# Patient Record
Sex: Female | Born: 1950 | Race: White | Hispanic: No | Marital: Married | Smoking: Never smoker
Health system: Southern US, Community
[De-identification: ages and names within clinical notes are randomized; demographics above are authoritative.]

## PROBLEM LIST (undated history)

## (undated) DIAGNOSIS — E785 Hyperlipidemia, unspecified: Secondary | ICD-10-CM

## (undated) DIAGNOSIS — I10 Essential (primary) hypertension: Secondary | ICD-10-CM

## (undated) DIAGNOSIS — K5792 Diverticulitis of intestine, part unspecified, without perforation or abscess without bleeding: Secondary | ICD-10-CM

## (undated) DIAGNOSIS — I8390 Asymptomatic varicose veins of unspecified lower extremity: Secondary | ICD-10-CM

## (undated) DIAGNOSIS — E119 Type 2 diabetes mellitus without complications: Secondary | ICD-10-CM

## (undated) HISTORY — DX: Asymptomatic varicose veins of unspecified lower extremity: I83.90

## (undated) HISTORY — DX: Type 2 diabetes mellitus without complications: E11.9

## (undated) HISTORY — DX: Hyperlipidemia, unspecified: E78.5

## (undated) HISTORY — PX: ABDOMINAL HYSTERECTOMY: SHX81

---

## 2002-09-01 ENCOUNTER — Encounter: Admission: RE | Admit: 2002-09-01 | Discharge: 2002-09-01 | Payer: Self-pay | Admitting: Urology

## 2002-09-01 ENCOUNTER — Encounter: Payer: Self-pay | Admitting: Urology

## 2003-06-14 ENCOUNTER — Other Ambulatory Visit: Admission: RE | Admit: 2003-06-14 | Discharge: 2003-06-14 | Payer: Self-pay | Admitting: Gynecology

## 2004-09-08 ENCOUNTER — Other Ambulatory Visit: Admission: RE | Admit: 2004-09-08 | Discharge: 2004-09-08 | Payer: Self-pay | Admitting: Gynecology

## 2005-12-28 ENCOUNTER — Other Ambulatory Visit: Admission: RE | Admit: 2005-12-28 | Discharge: 2005-12-28 | Payer: Self-pay | Admitting: Gynecology

## 2006-07-19 ENCOUNTER — Ambulatory Visit: Payer: Self-pay | Admitting: Pulmonary Disease

## 2006-08-17 ENCOUNTER — Ambulatory Visit: Payer: Self-pay | Admitting: Pulmonary Disease

## 2006-08-17 LAB — CONVERTED CEMR LAB
ALT: 18 units/L (ref 0–40)
AST: 26 units/L (ref 0–37)
Albumin: 4.1 g/dL (ref 3.5–5.2)
Alkaline Phosphatase: 73 units/L (ref 39–117)
BUN: 20 mg/dL (ref 6–23)
Basophils Absolute: 0 10*3/uL (ref 0.0–0.1)
Basophils Relative: 0.3 % (ref 0.0–1.0)
Bilirubin Urine: NEGATIVE
CO2: 25 meq/L (ref 19–32)
Calcium: 9.5 mg/dL (ref 8.4–10.5)
Chloride: 100 meq/L (ref 96–112)
Chol/HDL Ratio, serum: 5.9
Cholesterol: 226 mg/dL (ref 0–200)
Creatinine, Ser: 0.7 mg/dL (ref 0.4–1.2)
Eosinophil percent: 2 % (ref 0.0–5.0)
GFR calc non Af Amer: 92 mL/min
Glomerular Filtration Rate, Af Am: 111 mL/min/{1.73_m2}
Glucose, Bld: 112 mg/dL — ABNORMAL HIGH (ref 70–99)
HCT: 39.3 % (ref 36.0–46.0)
HDL: 38.3 mg/dL — ABNORMAL LOW (ref >39.0)
Hemoglobin, Urine: NEGATIVE
Hemoglobin: 13.4 g/dL (ref 12.0–15.0)
Ketones, ur: NEGATIVE mg/dL
LDL DIRECT: 165.5 mg/dL
Lymphocytes Relative: 40.7 % (ref 12.0–46.0)
MCHC: 34.1 g/dL (ref 30.0–36.0)
MCV: 90.7 fL (ref 78.0–100.0)
Monocytes Absolute: 0.4 10*3/uL (ref 0.2–0.7)
Monocytes Relative: 7.4 % (ref 3.0–11.0)
Mucus, UA: NEGATIVE
Neutro Abs: 2.5 10*3/uL (ref 1.4–7.7)
Neutrophils Relative %: 49.6 % (ref 43.0–77.0)
Nitrite: POSITIVE — AB
Platelets: 296 10*3/uL (ref 150–400)
Potassium: 4 meq/L (ref 3.5–5.1)
RBC / HPF: NONE SEEN
RBC: 4.33 M/uL (ref 3.87–5.11)
RDW: 13.2 % (ref 11.5–14.6)
Sodium: 134 meq/L — ABNORMAL LOW (ref 135–145)
Specific Gravity, Urine: 1.025 (ref 1.000–1.03)
TSH: 1.16 microintl units/mL (ref 0.35–5.50)
Total Bilirubin: 0.7 mg/dL (ref 0.3–1.2)
Total Protein, Urine: NEGATIVE mg/dL
Total Protein: 7.8 g/dL (ref 6.0–8.3)
Triglyceride fasting, serum: 167 mg/dL — ABNORMAL HIGH (ref 0–149)
Urine Glucose: NEGATIVE mg/dL
Urobilinogen, UA: 0.2 (ref 0.0–1.0)
VLDL: 33 mg/dL (ref 0–40)
WBC: 5 10*3/uL (ref 4.5–10.5)
pH: 5.5 (ref 5.0–8.0)

## 2006-12-06 ENCOUNTER — Other Ambulatory Visit: Admission: RE | Admit: 2006-12-06 | Discharge: 2006-12-06 | Payer: Self-pay | Admitting: Gynecology

## 2008-03-20 ENCOUNTER — Ambulatory Visit: Payer: Self-pay | Admitting: Internal Medicine

## 2008-03-20 DIAGNOSIS — J309 Allergic rhinitis, unspecified: Secondary | ICD-10-CM | POA: Insufficient documentation

## 2008-03-20 DIAGNOSIS — D126 Benign neoplasm of colon, unspecified: Secondary | ICD-10-CM

## 2008-03-20 DIAGNOSIS — M1990 Unspecified osteoarthritis, unspecified site: Secondary | ICD-10-CM | POA: Insufficient documentation

## 2008-03-20 DIAGNOSIS — J209 Acute bronchitis, unspecified: Secondary | ICD-10-CM

## 2008-03-20 DIAGNOSIS — I10 Essential (primary) hypertension: Secondary | ICD-10-CM | POA: Insufficient documentation

## 2008-03-20 DIAGNOSIS — K279 Peptic ulcer, site unspecified, unspecified as acute or chronic, without hemorrhage or perforation: Secondary | ICD-10-CM | POA: Insufficient documentation

## 2008-03-20 DIAGNOSIS — E7800 Pure hypercholesterolemia, unspecified: Secondary | ICD-10-CM | POA: Insufficient documentation

## 2008-03-20 DIAGNOSIS — F411 Generalized anxiety disorder: Secondary | ICD-10-CM | POA: Insufficient documentation

## 2008-03-30 ENCOUNTER — Encounter
Admission: RE | Admit: 2008-03-30 | Discharge: 2008-03-30 | Payer: Self-pay | Admitting: Physical Medicine and Rehabilitation

## 2008-05-16 ENCOUNTER — Encounter: Payer: Self-pay | Admitting: Endocrinology

## 2008-06-06 ENCOUNTER — Encounter: Payer: Self-pay | Admitting: Pulmonary Disease

## 2008-06-08 ENCOUNTER — Encounter: Payer: Self-pay | Admitting: Pulmonary Disease

## 2008-06-25 ENCOUNTER — Telehealth (INDEPENDENT_AMBULATORY_CARE_PROVIDER_SITE_OTHER): Payer: Self-pay | Admitting: *Deleted

## 2008-06-28 ENCOUNTER — Ambulatory Visit: Payer: Self-pay | Admitting: Endocrinology

## 2008-06-28 DIAGNOSIS — E119 Type 2 diabetes mellitus without complications: Secondary | ICD-10-CM | POA: Insufficient documentation

## 2008-08-13 ENCOUNTER — Encounter: Payer: Self-pay | Admitting: Pulmonary Disease

## 2010-05-29 ENCOUNTER — Encounter: Payer: Self-pay | Admitting: Pulmonary Disease

## 2010-09-20 ENCOUNTER — Encounter: Payer: Self-pay | Admitting: Pulmonary Disease

## 2010-10-02 NOTE — Letter (Signed)
Summary: Beather Arbour MD  Beather Arbour MD   Imported By: Lennie Odor 06/19/2010 12:08:06  _____________________________________________________________________  External Attachment:    Type:   Image     Comment:   External Document

## 2011-07-28 ENCOUNTER — Other Ambulatory Visit: Payer: Self-pay | Admitting: Gynecology

## 2012-10-18 ENCOUNTER — Other Ambulatory Visit: Payer: Self-pay | Admitting: Gynecology

## 2013-04-10 ENCOUNTER — Encounter: Payer: Self-pay | Admitting: Pulmonary Disease

## 2013-05-25 ENCOUNTER — Ambulatory Visit (INDEPENDENT_AMBULATORY_CARE_PROVIDER_SITE_OTHER): Payer: BC Managed Care – PPO

## 2013-05-25 ENCOUNTER — Ambulatory Visit (INDEPENDENT_AMBULATORY_CARE_PROVIDER_SITE_OTHER): Payer: BC Managed Care – PPO | Admitting: Neurology

## 2013-05-25 DIAGNOSIS — R209 Unspecified disturbances of skin sensation: Secondary | ICD-10-CM

## 2013-05-25 DIAGNOSIS — Z0289 Encounter for other administrative examinations: Secondary | ICD-10-CM

## 2013-05-25 NOTE — Procedures (Signed)
  HISTORY:  Carmen Davies is a 62 year old patient with a two-year history of some numbness in the right hand, occasionally associated with some achy sensations. The patient does have some fatigable weakness of the right hand. The patient denies any neck or shoulder discomfort. The patient is being evaluated for a possible neuropathy or a cervical radiculopathy.  NERVE CONDUCTION STUDIES:  Nerve conduction studies were performed on both upper extremities. The distal motor latencies and motor amplitudes for the median and ulnar nerves were within normal limits. The F wave latencies and nerve conduction velocities for these nerves were also normal. The sensory latencies for the median, radial, and ulnar nerves were normal.   EMG STUDIES:  EMG study was performed on the right upper extremity:  The first dorsal interosseous muscle reveals 2 to 4 K units with full recruitment. No fibrillations or positive waves were noted. The abductor pollicis brevis muscle reveals 2 to 4 K units with full recruitment. No fibrillations or positive waves were noted. The extensor indicis proprius muscle reveals 1 to 3 K units with full recruitment. No fibrillations or positive waves were noted. The pronator teres muscle reveals 2 to 3 K units with full recruitment. No fibrillations or positive waves were noted. The biceps muscle reveals 1 to 2 K units with full recruitment. No fibrillations or positive waves were noted. The triceps muscle reveals 2 to 4 K units with full recruitment. No fibrillations or positive waves were noted. The anterior deltoid muscle reveals 2 to 3 K units with full recruitment. No fibrillations or positive waves were noted. The cervical paraspinal muscles were tested at 2 levels. No abnormalities of insertional activity were seen at either level tested. There was fair relaxation.   IMPRESSION:  Nerve conduction studies done on the upper extremities were within normal limits. There is no  clear evidence of a peripheral neuropathy or mononeuropathy. EMG evaluation of the right upper extremity was unremarkable, without evidence of an overlying cervical radiculopathy.  Marlan Palau MD 05/25/2013 10:56 AM  [facility] [address] [place], [place] [address]  Phone [phone] Fax [phone]

## 2013-08-17 ENCOUNTER — Other Ambulatory Visit: Payer: Self-pay | Admitting: *Deleted

## 2013-08-17 DIAGNOSIS — I83893 Varicose veins of bilateral lower extremities with other complications: Secondary | ICD-10-CM

## 2013-08-22 ENCOUNTER — Telehealth: Payer: Self-pay | Admitting: Pulmonary Disease

## 2013-08-22 NOTE — Telephone Encounter (Signed)
Recd records from [facility] Forwarding 4pgs to Dr.Nadel

## 2013-08-23 ENCOUNTER — Other Ambulatory Visit: Payer: Self-pay | Admitting: *Deleted

## 2013-08-23 DIAGNOSIS — I83893 Varicose veins of bilateral lower extremities with other complications: Secondary | ICD-10-CM

## 2013-08-25 NOTE — Progress Notes (Signed)
NA

## 2013-09-25 ENCOUNTER — Other Ambulatory Visit: Payer: Self-pay | Admitting: *Deleted

## 2013-09-25 DIAGNOSIS — I83893 Varicose veins of bilateral lower extremities with other complications: Secondary | ICD-10-CM

## 2013-10-09 ENCOUNTER — Other Ambulatory Visit: Payer: Self-pay | Admitting: *Deleted

## 2013-10-09 ENCOUNTER — Encounter: Payer: Self-pay | Admitting: Vascular Surgery

## 2013-10-09 DIAGNOSIS — I83893 Varicose veins of bilateral lower extremities with other complications: Secondary | ICD-10-CM

## 2013-10-10 ENCOUNTER — Encounter (HOSPITAL_COMMUNITY): Payer: Self-pay

## 2013-10-10 ENCOUNTER — Encounter: Payer: Self-pay | Admitting: Vascular Surgery

## 2013-11-20 ENCOUNTER — Encounter: Payer: Self-pay | Admitting: Vascular Surgery

## 2013-11-21 ENCOUNTER — Ambulatory Visit (HOSPITAL_COMMUNITY)
Admission: RE | Admit: 2013-11-21 | Discharge: 2013-11-21 | Disposition: A | Discharge status: Home / Self Care | Payer: BC Managed Care – PPO | Source: Ambulatory Visit | Attending: Vascular Surgery | Admitting: Vascular Surgery

## 2013-11-21 ENCOUNTER — Encounter: Payer: Self-pay | Admitting: Vascular Surgery

## 2013-11-21 ENCOUNTER — Ambulatory Visit (INDEPENDENT_AMBULATORY_CARE_PROVIDER_SITE_OTHER): Payer: Self-pay | Admitting: Vascular Surgery

## 2013-11-21 ENCOUNTER — Telehealth: Payer: Self-pay | Admitting: Pulmonary Disease

## 2013-11-21 VITALS — BP 147/85 | HR 63 | Resp 16 | Ht 63.0 in | Wt 171.0 lb

## 2013-11-21 DIAGNOSIS — I83893 Varicose veins of bilateral lower extremities with other complications: Secondary | ICD-10-CM | POA: Insufficient documentation

## 2013-11-21 NOTE — Progress Notes (Signed)
Subjective:     Patient ID: Carmen Davies, female   DOB: [birth year], 63 y.o.   MRN: [id number]  HPI this 63 year old female was evaluated for bilateral varicose veins which are symptomatic. She has had these bulging veins for many years and they have become increasingly large with aching throbbing and burning discomfort. She also has distal edema in the legs as the day progresses. She has no history of stasis ulcers, bleeding, DVT, thrombophlebitis. She does not work last to compression stockings or elevator legs a regular basis. Symptoms have become much more significant over the past year.  Past Medical History  Diagnosis Date  . Varicose veins     History  Substance Use Topics  . Smoking status: Never Smoker   . Smokeless tobacco: Not on file  . Alcohol Use: No    History reviewed. No pertinent family history.  Allergies  Allergen Reactions  . Codeine     Current outpatient prescriptions:aspirin 325 MG tablet, Take 325 mg by mouth daily., Disp: , Rfl: ;  atorvastatin (LIPITOR) 10 MG tablet, Take 10 mg by mouth daily., Disp: , Rfl: ;  olmesartan-hydrochlorothiazide (BENICAR HCT) 20-12.5 MG per tablet, Take 1 tablet by mouth daily., Disp: , Rfl:   BP 147/85  Pulse 63  Resp 16  Ht 5\' 3"  (1.6 m)  Wt 171 lb (77.565 kg)  BMI 30.30 kg/m2  Body mass index is 30.3 kg/(m^2).          Review of Systems denies chest pain, dyspnea on exertion, PND, orthopnea, hemoptysis, lower extremity discomfort. Does have a history of low back pain and some nerve irritation in the right upper extremity. All other systems negative and a complete review of systems    Objective:   Physical Exam BP 147/85  Pulse 63  Resp 16  Ht 5\' 3"  (1.6 m)  Wt 171 lb (77.565 kg)  BMI 30.30 kg/m2  Gen.-alert and oriented x3 in no apparent distress HEENT normal for age Lungs no rhonchi or wheezing Cardiovascular regular rhythm no murmurs carotid pulses 3+ palpable no bruits audible Abdomen soft nontender  no palpable masses Musculoskeletal free of  major deformities Skin clear -no rashes Neurologic normal Lower extremities 3+ femoral and dorsalis pedis pulses palpable bilaterally with 1+ edema bilaterally Both legs have bulging varicosities. Left varicosities are more in the medial thigh anteriorly and posteriorly down into the medial calf. On the right side these began at the knee level and extended to the medial calf toward the medial malleolus. No hyperpigmentation or active ulceration is noted.  Today I ordered bilateral venous duplex exam which are reviewed and interpreted. The patient has gross reflux in bilateral great saphenous vein supplying these bulging varicosities with some deep venous reflux as well but no DVT.        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 X line patient return in 3 months for further examination

## 2013-11-21 NOTE — Telephone Encounter (Signed)
Received 5 pages from [facility], sent to Dr. Lenna Gilford. 11/21/13/ss.

## 2014-03-12 ENCOUNTER — Encounter: Payer: Self-pay | Admitting: Vascular Surgery

## 2014-03-13 ENCOUNTER — Ambulatory Visit (INDEPENDENT_AMBULATORY_CARE_PROVIDER_SITE_OTHER): Payer: BC Managed Care – PPO | Admitting: Vascular Surgery

## 2014-03-13 ENCOUNTER — Encounter: Payer: Self-pay | Admitting: Vascular Surgery

## 2014-03-13 VITALS — BP 144/81 | HR 68 | Resp 16 | Ht 63.0 in | Wt 170.0 lb

## 2014-03-13 DIAGNOSIS — I83893 Varicose veins of bilateral lower extremities with other complications: Secondary | ICD-10-CM

## 2014-03-13 NOTE — Progress Notes (Signed)
Subjective:     Patient ID: Carmen Davies, female   DOB: 13-Aug-1951, 63 y.o.   MRN: [id number]  HPI this 63 year old female returns for continued followup regarding her severe painful varicosities in both lower extremities. She has tried longer elastic compression stockings 20-30 mm gradient as well as elevation and ibuprofen and has had no improvement in her symptoms. She complains of heavy throbbing burning discomfort in both legs left worse than right. She has no history of DVT or thrombophlebitis. This is affecting her daily living.  Past Medical History  Diagnosis Date  . Varicose veins     History  Substance Use Topics  . Smoking status: Never Smoker   . Smokeless tobacco: Not on file  . Alcohol Use: No    No family history on file.  Allergies  Allergen Reactions  . Codeine     Current outpatient prescriptions:aspirin 325 MG tablet, Take 325 mg by mouth daily., Disp: , Rfl: ;  atorvastatin (LIPITOR) 10 MG tablet, Take 10 mg by mouth daily., Disp: , Rfl: ;  olmesartan-hydrochlorothiazide (BENICAR HCT) 20-12.5 MG per tablet, Take 1 tablet by mouth daily., Disp: , Rfl:   BP 144/81  Pulse 68  Resp 16  Ht 5\' 3"  (1.6 m)  Wt 170 lb (77.111 kg)  BMI 30.12 kg/m2  Body mass index is 30.12 kg/(m^2).           Review of Systems denies chest pain, dyspnea on exertion, PND, orthopnea, hemoptysis.     Objective:   Physical Exam BP 144/81  Pulse 68  Resp 16  Ht 5\' 3"  (1.6 m)  Wt 170 lb (77.111 kg)  BMI 30.12 kg/m2  General well-developed well-nourished female no apparent stress alert and oriented x3 Lungs no rhonchi or wheezing Left leg with bulging varicosities in the proximal medial thigh extending down to the distal thigh over the great saphenous vein as well as laterally in the calf area and ankle area. No hyperpigmentation noted 3+ dorsalis pedis pulse palpable. Right leg with bulging varicosities in the medial calf and thigh area and pretibial region with no  hyperpigmentation or ulceration. 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

## 2014-03-14 ENCOUNTER — Other Ambulatory Visit: Payer: Self-pay | Admitting: *Deleted

## 2014-03-14 DIAGNOSIS — I83893 Varicose veins of bilateral lower extremities with other complications: Secondary | ICD-10-CM

## 2014-04-27 ENCOUNTER — Encounter: Payer: Self-pay | Admitting: Vascular Surgery

## 2014-04-30 ENCOUNTER — Ambulatory Visit (INDEPENDENT_AMBULATORY_CARE_PROVIDER_SITE_OTHER): Payer: Medicare Other | Admitting: Vascular Surgery

## 2014-04-30 ENCOUNTER — Encounter: Payer: Self-pay | Admitting: Vascular Surgery

## 2014-04-30 VITALS — BP 129/75 | HR 72 | Resp 16 | Ht 64.0 in | Wt 175.0 lb

## 2014-04-30 DIAGNOSIS — I83893 Varicose veins of bilateral lower extremities with other complications: Secondary | ICD-10-CM

## 2014-04-30 NOTE — Progress Notes (Signed)
   Laser Ablation Procedure      Date: 04/30/2014    HIYA POINT DOB:06-04-1951  Consent signed: Yes  Surgeon:J.D. Kellie Simmering  Procedure: Laser Ablation: left Greater Saphenous Vein  BP 129/75  Pulse 72  Resp 16  Ht 5\' 4"  (1.626 m)  Wt 175 lb (79.379 kg)  BMI 30.02 kg/m2  Start time: 11   End time: 12:20  Tumescent Anesthesia: 350 cc 0.9% NaCl with 50 cc Lidocaine HCL with 1% Epi and 15 cc 8.4% NaHCO3  Local Anesthesia: 9 cc Lidocaine HCL and NaHCO3 (ratio 2:1)  Pulsed mode: 15 watts, 535ms delay, 1.0 duration Total energy: 1205, total pulses: 81, total time: 1:20     Stab Phlebectomy: >20 Sites: Thigh and Calf  Patient tolerated procedure well: Yes  Notes:   Description of Procedure:  After marking the course of the saphenous vein and the secondary varicosities in the standing position, the patient was placed on the operating table in the supine position, and the left leg was prepped and draped in sterile fashion. Local anesthetic was administered, and under ultrasound guidance the saphenous vein was accessed with a micro needle and guide wire; then the micro puncture sheath was placed. A guide wire was inserted to the saphenofemoral junction, followed by a 5 french sheath.  The position of the sheath and then the laser fiber below the junction was confirmed using the ultrasound and visualization of the aiming beam.  Tumescent anesthesia was administered along the course of the saphenous vein using ultrasound guidance. Protective laser glasses were placed on the patient, and the laser was fired at 15 watt pulsed mode advancing 1-2 mm per sec.  For a total of 1205 joules.  A steri strip was applied to the puncture site.  The patient was then put into Trendelenburg position.  Local anesthetic was utilized overlying the marked varicosities.  Greater than 20 stab wounds were made using the tip of an 11 blade; and using the vein hook,  The phlebectomies were performed using a  hemostat to avulse these varicosities.  Adequate hemostasis was achieved, and steri strips were applied to the stab wound.      ABD pads and thigh high compression stockings were applied.  Ace wrap bandages were applied over the phlebectomy sites and at the top of the saphenofemoral junction.  Blood loss was less than 15 cc.  The patient ambulated out of the operating room having tolerated the procedure well.

## 2014-04-30 NOTE — Progress Notes (Signed)
Subjective:     Patient ID: Carmen Davies, female   DOB: February 03, 1951, 63 y.o.   MRN: [id number]  HPI this 63 year old female had laser ablation of the left great saphenous vein from the mid thigh up to the saphenofemoral junction plus greater than 20 stab phlebectomy of painful varicosities performed under local tumescent anesthesia she tolerated the procedure well. A total of 1205 J of energy was utilized   Review of Systems     Objective:   Physical Exam BP 129/75  Pulse 72  Resp 16  Ht 5\' 4"  (1.626 m)  Wt 175 lb (79.379 kg)  BMI 30.02 kg/m2       Assessment:     Well-tolerated laser ablation left great saphenous vein performed under local tumescent anesthesia with multiple stab phlebectomy of painful varicosities    Plan:     Returned September 8 for venous duplex exam to confirm closure left great saphenous vein

## 2014-05-01 ENCOUNTER — Encounter: Payer: Self-pay | Admitting: Vascular Surgery

## 2014-05-01 ENCOUNTER — Telehealth: Payer: Self-pay | Admitting: *Deleted

## 2014-05-01 NOTE — Telephone Encounter (Signed)
Left a meassage for her to call me if she has any concerns.

## 2014-05-04 ENCOUNTER — Encounter: Payer: Self-pay | Admitting: Vascular Surgery

## 2014-05-04 ENCOUNTER — Encounter: Payer: Self-pay | Admitting: Pulmonary Disease

## 2014-05-08 ENCOUNTER — Ambulatory Visit (HOSPITAL_COMMUNITY)
Admission: RE | Admit: 2014-05-08 | Discharge: 2014-05-08 | Disposition: A | Discharge status: Home / Self Care | Payer: BC Managed Care – PPO | Source: Ambulatory Visit | Attending: Vascular Surgery | Admitting: Vascular Surgery

## 2014-05-08 ENCOUNTER — Encounter: Payer: Self-pay | Admitting: Vascular Surgery

## 2014-05-08 ENCOUNTER — Ambulatory Visit (INDEPENDENT_AMBULATORY_CARE_PROVIDER_SITE_OTHER): Payer: Self-pay | Admitting: Vascular Surgery

## 2014-05-08 VITALS — BP 144/80 | HR 66 | Resp 16 | Ht 64.0 in | Wt 168.0 lb

## 2014-05-08 DIAGNOSIS — I83893 Varicose veins of bilateral lower extremities with other complications: Secondary | ICD-10-CM

## 2014-05-08 DIAGNOSIS — Z9889 Other specified postprocedural states: Secondary | ICD-10-CM | POA: Diagnosis not present

## 2014-05-08 NOTE — Progress Notes (Signed)
Subjective:     Patient ID: Carmen Davies, female   DOB: [birth year], 63 y.o.   MRN: [id number]  HPI this 63 year old female returns 1 week post laser ablation left great saphenous line with greater than 20 stab phlebectomy of painful varicosities. She has had some mild discomfort in the left thigh but has had no significant swelling, chest pain, dyspnea on exertion, or hemoptysis. She has been wearing her long leg elastic compression stocking as instructed.  Review of Systems     Objective:   Physical Exam BP 144/80  Pulse 66  Resp 16  Ht 5\' 4"  (1.626 m)  Wt 168 lb (76.204 kg)  BMI 28.82 kg/m2   general well-developed well-nourished female in no apparent distress Lungs no rhonchi or wheezing left leg with mild discomfort along course of mid great saphenous vein in thigh area with some moderate ecchymosis. Stab phlebectomy sites healing nicely. No distal edema noted. 3 posterior cells pedis pulse palpable.  Today I will order venous duplex exam of the left leg which I reviewed and interpreted. There is no DVT. The left great saphenous vein is totally closed from the groin to the knee.    Assessment:     successful laser ablation left great saphenous line with multiple stab phlebectomy of painful varicosities    Plan:     Return soon for laser ablation right great saphenous vein with multiple stab phlebectomy

## 2014-05-21 ENCOUNTER — Other Ambulatory Visit: Payer: BC Managed Care – PPO | Admitting: Vascular Surgery

## 2014-05-28 ENCOUNTER — Other Ambulatory Visit: Payer: BC Managed Care – PPO | Admitting: Vascular Surgery

## 2014-05-29 ENCOUNTER — Encounter: Payer: Self-pay | Admitting: Family

## 2014-05-30 ENCOUNTER — Encounter: Payer: Self-pay | Admitting: Vascular Surgery

## 2014-05-30 ENCOUNTER — Ambulatory Visit: Payer: BC Managed Care – PPO | Admitting: Vascular Surgery

## 2014-05-30 ENCOUNTER — Other Ambulatory Visit: Payer: BC Managed Care – PPO | Admitting: Vascular Surgery

## 2014-05-30 ENCOUNTER — Ambulatory Visit (INDEPENDENT_AMBULATORY_CARE_PROVIDER_SITE_OTHER): Payer: BC Managed Care – PPO | Admitting: Vascular Surgery

## 2014-05-30 ENCOUNTER — Encounter (HOSPITAL_COMMUNITY): Payer: BC Managed Care – PPO

## 2014-05-30 VITALS — BP 128/80 | HR 84 | Resp 16 | Ht 63.0 in | Wt 170.0 lb

## 2014-05-30 DIAGNOSIS — I83893 Varicose veins of bilateral lower extremities with other complications: Secondary | ICD-10-CM

## 2014-05-30 NOTE — Progress Notes (Signed)
Subjective:     Patient ID: Carmen Davies, female   DOB: 09/25/1950, 63 y.o.   MRN: [id number]  HPI this 63 year old female had laser ablation right great saphenous vein +10-20 stab phlebectomy of painful varicosities all performed and local tumescent anesthesia. She tolerated surgery well. A total of 1571 J of energy was utilized.  Review of Systems     Objective:   Physical Exam BP 128/80  Pulse 84  Resp 16  Ht 5\' 3"  (1.6 m)  Wt 170 lb (77.111 kg)  BMI 30.12 kg/m2       Assessment:     Well-tolerated laser ablation right great saphenous vein from distal thigh to saphenofemoral junction +10-20 stab phlebectomy of painful varicosities under local tumescent anesthesia    Plan:     Return in one week for venous duplex exam to confirm closure right great saphenous vein

## 2014-05-30 NOTE — Progress Notes (Signed)
   Laser Ablation Procedure      Date: 05/30/2014    [name] DOB:[birth year]  Consent signed: Yes  Surgeon:J.D. Kellie Simmering  Procedure: Laser Ablation: right Greater Saphenous Vein  BP 128/80  Pulse 84  Resp 16  Ht 5\' 3"  (1.6 m)  Wt 170 lb (77.111 kg)  BMI 30.12 kg/m2  Start time: 11   End time: 12:15  Tumescent Anesthesia: 410 cc 0.9% NaCl with 50 cc Lidocaine HCL with 1% Epi and 15 cc 8.4% NaHCO3  Local Anesthesia: 9 cc Lidocaine HCL and NaHCO3 (ratio 2:1)  Pulsed mode: 15 watts, 572ms delay, 1.9 duration  Total energy: 1571, total pulses: 105, total time: 1:45     Stab Phlebectomy: 10-20 Sites: Thigh and Calf  Patient tolerated procedure well: Yes  Notes:   Description of Procedure:  After marking the course of the secondary varicosities, the patient was placed on the operating table in the supine position, and the right leg was prepped and draped in sterile fashion.   Local anesthetic was administered and under ultrasound guidance the saphenous vein was accessed with a micro needle and guide wire; then the mirco puncture sheath was place.  A guide wire was inserted saphenofemoral junction , followed by a 5 french sheath.  The position of the sheath and then the laser fiber below the junction was confirmed using the ultrasound.  Tumescent anesthesia was administered along the course of the saphenous vein using ultrasound guidance. The patient was placed in Trendelenburg position and protective laser glasses were placed on patient and staff, and the laser was fired at 15 watt pulsed mode advancing 1-2 mm per sec for a total of 1571 joules.   For stab phlebectomies, local anesthetic was administered at the previously marked varicosities, and tumescent anesthesia was administered around the vessels.  Ten to 20 stab wounds were made using the tip of an 11 blade. And using the vein hook, the phlebectomies were performed using a hemostat to avulse the varicosities.  Adequate  hemostasis was achieved.     Steri strips were applied to the stab wounds and ABD pads and thigh high compression stockings were applied.  Ace wrap bandages were applied over the phlebectomy sites and at the top of the saphenofemoral junction. Blood loss was less than 15 cc.  The patient ambulated out of the operating room having tolerated the procedure well.

## 2014-05-31 ENCOUNTER — Encounter: Payer: Self-pay | Admitting: Vascular Surgery

## 2014-06-01 ENCOUNTER — Encounter: Payer: Self-pay | Admitting: Vascular Surgery

## 2014-06-04 ENCOUNTER — Ambulatory Visit: Payer: BC Managed Care – PPO | Admitting: Vascular Surgery

## 2014-06-04 ENCOUNTER — Encounter (HOSPITAL_COMMUNITY): Payer: BC Managed Care – PPO

## 2014-06-04 ENCOUNTER — Encounter: Payer: Self-pay | Admitting: Vascular Surgery

## 2014-06-04 ENCOUNTER — Ambulatory Visit (HOSPITAL_COMMUNITY)
Admission: RE | Admit: 2014-06-04 | Discharge: 2014-06-04 | Disposition: A | Discharge status: Home / Self Care | Payer: BC Managed Care – PPO | Source: Ambulatory Visit | Attending: Vascular Surgery | Admitting: Vascular Surgery

## 2014-06-04 ENCOUNTER — Ambulatory Visit (INDEPENDENT_AMBULATORY_CARE_PROVIDER_SITE_OTHER): Payer: Self-pay | Admitting: Vascular Surgery

## 2014-06-04 VITALS — BP 149/90 | HR 74 | Resp 16 | Ht 63.0 in | Wt 165.0 lb

## 2014-06-04 DIAGNOSIS — I83891 Varicose veins of right lower extremities with other complications: Secondary | ICD-10-CM

## 2014-06-04 DIAGNOSIS — I83899 Varicose veins of unspecified lower extremities with other complications: Secondary | ICD-10-CM | POA: Insufficient documentation

## 2014-06-04 DIAGNOSIS — I8393 Asymptomatic varicose veins of bilateral lower extremities: Secondary | ICD-10-CM | POA: Insufficient documentation

## 2014-06-04 NOTE — Progress Notes (Signed)
Subjective:     Patient ID: Carmen Davies, female   DOB: [birth year], 63 y.o.   MRN: [id number]  HPI this 63 year old female returns 1 week post laser ablation right great saphenous vein with multiple stab phlebectomy of painful varicosities. She has had mild discomfort in the thigh. She has one her last compression stocking. She has had no distal edema. She has taken ibuprofen as instructed. She denies any chest pain dyspnea on exertion PND or orthopnea.  Review of Systems     Objective:   Physical Exam BP 149/90  Pulse 74  Resp 16  Ht 5\' 3"  (1.6 m)  Wt 165 lb (74.844 kg)  BMI 29.24 kg/m2  General well-developed well-nourished female no apparent stress alert and oriented x3 Lungs no rhonchi or wheezing Right leg with mild discomfort along course of the great saphenous vein from distal thigh to near the saphenofemoral junction. Stab phlebectomy sites are healing nicely. No distal edema noted. 3 + dorsalis pedis pulse palpable.  Today I ordered a venous duplex exam of the right leg which I reviewed and interpreted. There is no DVT. The great saphenous vein is closed from distal thigh to near the saphenofemoral junction.     Assessment:     Successful bilateral laser blush and right saphenous veins with multiple stab lipectomy of painful varicosities    Plan:     Return on when necessary basis

## 2014-07-02 ENCOUNTER — Other Ambulatory Visit: Payer: Self-pay | Admitting: General Surgery

## 2014-07-02 DIAGNOSIS — Z86010 Personal history of colonic polyps: Secondary | ICD-10-CM

## 2015-02-14 ENCOUNTER — Ambulatory Visit: Payer: BLUE CROSS/BLUE SHIELD

## 2015-02-21 ENCOUNTER — Ambulatory Visit: Payer: BLUE CROSS/BLUE SHIELD

## 2015-02-26 ENCOUNTER — Encounter: Payer: BLUE CROSS/BLUE SHIELD | Attending: Geriatric Medicine

## 2015-02-26 VITALS — Ht 63.5 in | Wt 172.0 lb

## 2015-02-26 DIAGNOSIS — E119 Type 2 diabetes mellitus without complications: Secondary | ICD-10-CM | POA: Insufficient documentation

## 2015-02-26 DIAGNOSIS — Z713 Dietary counseling and surveillance: Secondary | ICD-10-CM | POA: Insufficient documentation

## 2015-02-26 NOTE — Progress Notes (Signed)

## 2015-02-28 ENCOUNTER — Ambulatory Visit: Payer: BLUE CROSS/BLUE SHIELD

## 2015-03-05 ENCOUNTER — Encounter: Payer: BLUE CROSS/BLUE SHIELD | Attending: Geriatric Medicine

## 2015-03-05 DIAGNOSIS — Z713 Dietary counseling and surveillance: Secondary | ICD-10-CM | POA: Insufficient documentation

## 2015-03-05 DIAGNOSIS — E119 Type 2 diabetes mellitus without complications: Secondary | ICD-10-CM | POA: Diagnosis not present

## 2015-03-05 NOTE — Progress Notes (Signed)
Patient was seen on 03/05/15 for the second of a series of three diabetes self-management courses at the [facility]. The following learning objectives were met by the patient during this class:   Describe the role of different macronutrients on glucose  Explain how carbohydrates affect blood glucose  State what foods contain the most carbohydrates  Demonstrate carbohydrate counting  Demonstrate how to read Nutrition Facts food label  Describe effects of various fats on heart health  Describe the importance of good nutrition for health and healthy eating strategies  Describe techniques for managing your shopping, cooking and meal planning  List strategies to follow meal plan when dining out  Describe the effects of alcohol on glucose and how to use it safely  Goals:  Follow Diabetes Meal Plan as instructed  Eat 3 meals and 2 snacks, every 3-5 hrs  Aim for carbohydrate intake of 45 grams carbohydrate/meal Limit carbohydrate intake to 0-15 grams carbohydrate/snack Add lean protein foods to meals/snacks  Monitor glucose levels as instructed by your doctor   Follow-Up Plan:  Attend Core 3  Work towards following your personal food plan.

## 2015-03-12 DIAGNOSIS — E119 Type 2 diabetes mellitus without complications: Secondary | ICD-10-CM | POA: Diagnosis not present

## 2015-03-12 NOTE — Progress Notes (Signed)
Patient was seen on 03/12/15 for the third of a series of three diabetes self-management courses at the [facility].   Carmen Davies the amount of activity recommended for healthy living . Describe activities suitable for individual needs . Identify ways to regularly incorporate activity into daily life . Identify barriers to activity and ways to over come these barriers  Identify diabetes medications being personally used and their primary action for lowering glucose and possible side effects . Describe role of stress on blood glucose and develop strategies to address psychosocial issues . Identify diabetes complications and ways to prevent them  Explain how to manage diabetes during illness . Evaluate success in meeting personal goal . Establish 2-3 goals that they will plan to diligently work on until they return for the  87-month follow-up visit  Goals:   I will count my carb choices at most meals and snacks  I will eat less unhealthy fats   Your patient has identified these potential barriers to change:  None stated  Your patient has identified their diabetes self-care support plan as  Family Support Plan:  Attend Core 4 in 4 months

## 2015-07-31 ENCOUNTER — Encounter: Payer: BLUE CROSS/BLUE SHIELD | Attending: Geriatric Medicine

## 2015-07-31 DIAGNOSIS — E119 Type 2 diabetes mellitus without complications: Secondary | ICD-10-CM | POA: Insufficient documentation

## 2015-07-31 DIAGNOSIS — Z713 Dietary counseling and surveillance: Secondary | ICD-10-CM | POA: Diagnosis not present

## 2015-08-01 NOTE — Progress Notes (Signed)
Appt start time: 1730 end time:  1830.  Patient was seen on 07/31/2015 for a review of the series of three diabetes self-management courses at the [facility]. The following learning objectives were met by the patient during this class:  . Reviewed blood glucose monitoring and interpretation including the recommended target ranges and Hgb A1c.  . Reviewed on carb counting, importance of regularly scheduled meals/snacks, and meal planning.  . Reviewed the effects of physical activity on glucose levels and long-term glucose control.  Recommended goal of 150 minutes of physical activity/week. . Reviewed patient medications and discussed role of medication on blood glucose and possible side effects. . Discussed strategies to manage stress, psychosocial issues, and other obstacles to diabetes management. . Encouraged moderate weight reduction to improve glucose levels.   . Reviewed short-term complications: hyper- and hypo-glycemia.  Discussed causes, symptoms, and treatment options. . Reviewed prevention, detection, and treatment of long-term complications.  Discussed the role of prolonged elevated glucose levels on body systems.  Goals:  Follow Diabetes Meal Plan as instructed  Eat 3 meals and 2 snacks, every 3-5 hrs  Limit carbohydrate intake to 45 grams carbohydrate/meal Limit carbohydrate intake to 15 grams carbohydrate/snack Add lean protein foods to meals/snacks  Monitor glucose levels as instructed by your doctor  Aim for goal of 15-30 mins of physical activity daily as tolerated  Bring food record and glucose log to your next nutrition visit

## 2015-11-04 DIAGNOSIS — M5137 Other intervertebral disc degeneration, lumbosacral region: Secondary | ICD-10-CM | POA: Diagnosis not present

## 2015-11-04 DIAGNOSIS — M47817 Spondylosis without myelopathy or radiculopathy, lumbosacral region: Secondary | ICD-10-CM | POA: Diagnosis not present

## 2015-11-04 DIAGNOSIS — M5417 Radiculopathy, lumbosacral region: Secondary | ICD-10-CM | POA: Diagnosis not present

## 2015-11-04 DIAGNOSIS — G894 Chronic pain syndrome: Secondary | ICD-10-CM | POA: Diagnosis not present

## 2015-11-26 DIAGNOSIS — E119 Type 2 diabetes mellitus without complications: Secondary | ICD-10-CM | POA: Diagnosis not present

## 2015-11-26 DIAGNOSIS — Z23 Encounter for immunization: Secondary | ICD-10-CM | POA: Diagnosis not present

## 2015-11-26 DIAGNOSIS — Z7984 Long term (current) use of oral hypoglycemic drugs: Secondary | ICD-10-CM | POA: Diagnosis not present

## 2015-11-26 DIAGNOSIS — I10 Essential (primary) hypertension: Secondary | ICD-10-CM | POA: Diagnosis not present

## 2016-06-05 DIAGNOSIS — H6122 Impacted cerumen, left ear: Secondary | ICD-10-CM | POA: Diagnosis not present

## 2016-06-05 DIAGNOSIS — Z79899 Other long term (current) drug therapy: Secondary | ICD-10-CM | POA: Diagnosis not present

## 2016-06-05 DIAGNOSIS — Z7984 Long term (current) use of oral hypoglycemic drugs: Secondary | ICD-10-CM | POA: Diagnosis not present

## 2016-06-05 DIAGNOSIS — M8588 Other specified disorders of bone density and structure, other site: Secondary | ICD-10-CM | POA: Diagnosis not present

## 2016-06-05 DIAGNOSIS — Z23 Encounter for immunization: Secondary | ICD-10-CM | POA: Diagnosis not present

## 2016-06-05 DIAGNOSIS — E7800 Pure hypercholesterolemia, unspecified: Secondary | ICD-10-CM | POA: Diagnosis not present

## 2016-06-05 DIAGNOSIS — Z0000 Encounter for general adult medical examination without abnormal findings: Secondary | ICD-10-CM | POA: Diagnosis not present

## 2016-06-05 DIAGNOSIS — I10 Essential (primary) hypertension: Secondary | ICD-10-CM | POA: Diagnosis not present

## 2016-06-05 DIAGNOSIS — Z1389 Encounter for screening for other disorder: Secondary | ICD-10-CM | POA: Diagnosis not present

## 2016-06-05 DIAGNOSIS — D126 Benign neoplasm of colon, unspecified: Secondary | ICD-10-CM | POA: Diagnosis not present

## 2016-06-05 DIAGNOSIS — E119 Type 2 diabetes mellitus without complications: Secondary | ICD-10-CM | POA: Diagnosis not present

## 2016-06-25 DIAGNOSIS — M8588 Other specified disorders of bone density and structure, other site: Secondary | ICD-10-CM | POA: Diagnosis not present

## 2016-09-25 DIAGNOSIS — K5730 Diverticulosis of large intestine without perforation or abscess without bleeding: Secondary | ICD-10-CM | POA: Diagnosis not present

## 2016-09-25 DIAGNOSIS — Z86010 Personal history of colonic polyps: Secondary | ICD-10-CM | POA: Diagnosis not present

## 2016-10-30 DIAGNOSIS — I10 Essential (primary) hypertension: Secondary | ICD-10-CM | POA: Diagnosis not present

## 2016-10-30 DIAGNOSIS — Z7984 Long term (current) use of oral hypoglycemic drugs: Secondary | ICD-10-CM | POA: Diagnosis not present

## 2016-10-30 DIAGNOSIS — E119 Type 2 diabetes mellitus without complications: Secondary | ICD-10-CM | POA: Diagnosis not present

## 2017-01-05 DIAGNOSIS — I10 Essential (primary) hypertension: Secondary | ICD-10-CM | POA: Diagnosis not present

## 2017-01-05 DIAGNOSIS — Z7984 Long term (current) use of oral hypoglycemic drugs: Secondary | ICD-10-CM | POA: Diagnosis not present

## 2017-01-05 DIAGNOSIS — E119 Type 2 diabetes mellitus without complications: Secondary | ICD-10-CM | POA: Diagnosis not present

## 2017-04-20 DIAGNOSIS — H2513 Age-related nuclear cataract, bilateral: Secondary | ICD-10-CM | POA: Diagnosis not present

## 2017-06-18 DIAGNOSIS — M8580 Other specified disorders of bone density and structure, unspecified site: Secondary | ICD-10-CM | POA: Diagnosis not present

## 2017-06-18 DIAGNOSIS — E663 Overweight: Secondary | ICD-10-CM | POA: Diagnosis not present

## 2017-06-18 DIAGNOSIS — E1165 Type 2 diabetes mellitus with hyperglycemia: Secondary | ICD-10-CM | POA: Diagnosis not present

## 2017-06-18 DIAGNOSIS — Z0000 Encounter for general adult medical examination without abnormal findings: Secondary | ICD-10-CM | POA: Diagnosis not present

## 2017-06-18 DIAGNOSIS — Z79899 Other long term (current) drug therapy: Secondary | ICD-10-CM | POA: Diagnosis not present

## 2017-06-18 DIAGNOSIS — E7800 Pure hypercholesterolemia, unspecified: Secondary | ICD-10-CM | POA: Diagnosis not present

## 2017-06-18 DIAGNOSIS — Z1389 Encounter for screening for other disorder: Secondary | ICD-10-CM | POA: Diagnosis not present

## 2017-06-18 DIAGNOSIS — I10 Essential (primary) hypertension: Secondary | ICD-10-CM | POA: Diagnosis not present

## 2017-07-07 DIAGNOSIS — Z1231 Encounter for screening mammogram for malignant neoplasm of breast: Secondary | ICD-10-CM | POA: Diagnosis not present

## 2017-08-25 DIAGNOSIS — I10 Essential (primary) hypertension: Secondary | ICD-10-CM | POA: Diagnosis not present

## 2017-12-28 DIAGNOSIS — I10 Essential (primary) hypertension: Secondary | ICD-10-CM | POA: Diagnosis not present

## 2017-12-28 DIAGNOSIS — E1169 Type 2 diabetes mellitus with other specified complication: Secondary | ICD-10-CM | POA: Diagnosis not present

## 2017-12-28 DIAGNOSIS — E7800 Pure hypercholesterolemia, unspecified: Secondary | ICD-10-CM | POA: Diagnosis not present

## 2017-12-28 DIAGNOSIS — Z79899 Other long term (current) drug therapy: Secondary | ICD-10-CM | POA: Diagnosis not present

## 2018-03-07 DIAGNOSIS — Z79899 Other long term (current) drug therapy: Secondary | ICD-10-CM | POA: Diagnosis not present

## 2018-06-07 DIAGNOSIS — I10 Essential (primary) hypertension: Secondary | ICD-10-CM | POA: Diagnosis not present

## 2018-06-07 DIAGNOSIS — E1169 Type 2 diabetes mellitus with other specified complication: Secondary | ICD-10-CM | POA: Diagnosis not present

## 2018-06-20 DIAGNOSIS — Z23 Encounter for immunization: Secondary | ICD-10-CM | POA: Diagnosis not present

## 2018-06-20 DIAGNOSIS — Z79899 Other long term (current) drug therapy: Secondary | ICD-10-CM | POA: Diagnosis not present

## 2018-06-20 DIAGNOSIS — Z1389 Encounter for screening for other disorder: Secondary | ICD-10-CM | POA: Diagnosis not present

## 2018-06-20 DIAGNOSIS — E7800 Pure hypercholesterolemia, unspecified: Secondary | ICD-10-CM | POA: Diagnosis not present

## 2018-06-20 DIAGNOSIS — E1169 Type 2 diabetes mellitus with other specified complication: Secondary | ICD-10-CM | POA: Diagnosis not present

## 2018-06-20 DIAGNOSIS — I10 Essential (primary) hypertension: Secondary | ICD-10-CM | POA: Diagnosis not present

## 2018-06-20 DIAGNOSIS — Z0000 Encounter for general adult medical examination without abnormal findings: Secondary | ICD-10-CM | POA: Diagnosis not present

## 2018-07-08 DIAGNOSIS — Z1231 Encounter for screening mammogram for malignant neoplasm of breast: Secondary | ICD-10-CM | POA: Diagnosis not present

## 2018-08-18 DIAGNOSIS — E1169 Type 2 diabetes mellitus with other specified complication: Secondary | ICD-10-CM | POA: Diagnosis not present

## 2018-08-18 DIAGNOSIS — I10 Essential (primary) hypertension: Secondary | ICD-10-CM | POA: Diagnosis not present

## 2018-09-11 DIAGNOSIS — R112 Nausea with vomiting, unspecified: Secondary | ICD-10-CM | POA: Diagnosis not present

## 2018-09-11 DIAGNOSIS — K760 Fatty (change of) liver, not elsewhere classified: Secondary | ICD-10-CM | POA: Diagnosis not present

## 2018-09-11 DIAGNOSIS — K921 Melena: Secondary | ICD-10-CM | POA: Diagnosis not present

## 2018-09-11 DIAGNOSIS — K5733 Diverticulitis of large intestine without perforation or abscess with bleeding: Secondary | ICD-10-CM | POA: Diagnosis not present

## 2018-09-11 DIAGNOSIS — I700 Atherosclerosis of aorta: Secondary | ICD-10-CM | POA: Diagnosis not present

## 2018-09-11 DIAGNOSIS — E785 Hyperlipidemia, unspecified: Secondary | ICD-10-CM | POA: Diagnosis not present

## 2018-09-11 DIAGNOSIS — I10 Essential (primary) hypertension: Secondary | ICD-10-CM | POA: Diagnosis not present

## 2018-09-11 DIAGNOSIS — R197 Diarrhea, unspecified: Secondary | ICD-10-CM | POA: Diagnosis not present

## 2018-09-14 DIAGNOSIS — K921 Melena: Secondary | ICD-10-CM | POA: Diagnosis not present

## 2018-09-14 DIAGNOSIS — A09 Infectious gastroenteritis and colitis, unspecified: Secondary | ICD-10-CM | POA: Diagnosis not present

## 2018-09-14 DIAGNOSIS — I700 Atherosclerosis of aorta: Secondary | ICD-10-CM | POA: Diagnosis not present

## 2018-09-14 DIAGNOSIS — I10 Essential (primary) hypertension: Secondary | ICD-10-CM | POA: Diagnosis not present

## 2018-09-21 DIAGNOSIS — E119 Type 2 diabetes mellitus without complications: Secondary | ICD-10-CM | POA: Diagnosis not present

## 2018-09-21 DIAGNOSIS — H2513 Age-related nuclear cataract, bilateral: Secondary | ICD-10-CM | POA: Diagnosis not present

## 2018-09-26 DIAGNOSIS — E1169 Type 2 diabetes mellitus with other specified complication: Secondary | ICD-10-CM | POA: Diagnosis not present

## 2018-09-26 DIAGNOSIS — I10 Essential (primary) hypertension: Secondary | ICD-10-CM | POA: Diagnosis not present

## 2018-10-18 DIAGNOSIS — E1169 Type 2 diabetes mellitus with other specified complication: Secondary | ICD-10-CM | POA: Diagnosis not present

## 2018-11-01 DIAGNOSIS — I10 Essential (primary) hypertension: Secondary | ICD-10-CM | POA: Diagnosis not present

## 2018-11-01 DIAGNOSIS — E1169 Type 2 diabetes mellitus with other specified complication: Secondary | ICD-10-CM | POA: Diagnosis not present

## 2018-11-04 DIAGNOSIS — N281 Cyst of kidney, acquired: Secondary | ICD-10-CM | POA: Diagnosis not present

## 2018-11-04 DIAGNOSIS — K5780 Diverticulitis of intestine, part unspecified, with perforation and abscess without bleeding: Secondary | ICD-10-CM | POA: Diagnosis not present

## 2018-12-08 DIAGNOSIS — I10 Essential (primary) hypertension: Secondary | ICD-10-CM | POA: Diagnosis not present

## 2018-12-08 DIAGNOSIS — M8580 Other specified disorders of bone density and structure, unspecified site: Secondary | ICD-10-CM | POA: Diagnosis not present

## 2018-12-08 DIAGNOSIS — E1169 Type 2 diabetes mellitus with other specified complication: Secondary | ICD-10-CM | POA: Diagnosis not present

## 2018-12-08 DIAGNOSIS — E7800 Pure hypercholesterolemia, unspecified: Secondary | ICD-10-CM | POA: Diagnosis not present

## 2018-12-15 DIAGNOSIS — S83422A Sprain of lateral collateral ligament of left knee, initial encounter: Secondary | ICD-10-CM | POA: Diagnosis not present

## 2018-12-15 DIAGNOSIS — I700 Atherosclerosis of aorta: Secondary | ICD-10-CM | POA: Diagnosis not present

## 2018-12-15 DIAGNOSIS — I10 Essential (primary) hypertension: Secondary | ICD-10-CM | POA: Diagnosis not present

## 2018-12-15 DIAGNOSIS — E1169 Type 2 diabetes mellitus with other specified complication: Secondary | ICD-10-CM | POA: Diagnosis not present

## 2018-12-15 DIAGNOSIS — E7800 Pure hypercholesterolemia, unspecified: Secondary | ICD-10-CM | POA: Diagnosis not present

## 2019-03-06 DIAGNOSIS — I10 Essential (primary) hypertension: Secondary | ICD-10-CM | POA: Diagnosis not present

## 2019-03-06 DIAGNOSIS — E7800 Pure hypercholesterolemia, unspecified: Secondary | ICD-10-CM | POA: Diagnosis not present

## 2019-03-06 DIAGNOSIS — M8580 Other specified disorders of bone density and structure, unspecified site: Secondary | ICD-10-CM | POA: Diagnosis not present

## 2019-03-06 DIAGNOSIS — E1169 Type 2 diabetes mellitus with other specified complication: Secondary | ICD-10-CM | POA: Diagnosis not present

## 2019-06-09 DIAGNOSIS — M8580 Other specified disorders of bone density and structure, unspecified site: Secondary | ICD-10-CM | POA: Diagnosis not present

## 2019-06-09 DIAGNOSIS — E7800 Pure hypercholesterolemia, unspecified: Secondary | ICD-10-CM | POA: Diagnosis not present

## 2019-06-09 DIAGNOSIS — I10 Essential (primary) hypertension: Secondary | ICD-10-CM | POA: Diagnosis not present

## 2019-06-09 DIAGNOSIS — E1169 Type 2 diabetes mellitus with other specified complication: Secondary | ICD-10-CM | POA: Diagnosis not present

## 2019-06-28 DIAGNOSIS — E7800 Pure hypercholesterolemia, unspecified: Secondary | ICD-10-CM | POA: Diagnosis not present

## 2019-06-28 DIAGNOSIS — Z1389 Encounter for screening for other disorder: Secondary | ICD-10-CM | POA: Diagnosis not present

## 2019-06-28 DIAGNOSIS — I10 Essential (primary) hypertension: Secondary | ICD-10-CM | POA: Diagnosis not present

## 2019-06-28 DIAGNOSIS — Z79899 Other long term (current) drug therapy: Secondary | ICD-10-CM | POA: Diagnosis not present

## 2019-06-28 DIAGNOSIS — E1169 Type 2 diabetes mellitus with other specified complication: Secondary | ICD-10-CM | POA: Diagnosis not present

## 2019-06-28 DIAGNOSIS — Z0000 Encounter for general adult medical examination without abnormal findings: Secondary | ICD-10-CM | POA: Diagnosis not present

## 2019-06-28 DIAGNOSIS — I700 Atherosclerosis of aorta: Secondary | ICD-10-CM | POA: Diagnosis not present

## 2019-07-10 DIAGNOSIS — Z1231 Encounter for screening mammogram for malignant neoplasm of breast: Secondary | ICD-10-CM | POA: Diagnosis not present

## 2019-08-21 DIAGNOSIS — M8580 Other specified disorders of bone density and structure, unspecified site: Secondary | ICD-10-CM | POA: Diagnosis not present

## 2019-08-21 DIAGNOSIS — E7800 Pure hypercholesterolemia, unspecified: Secondary | ICD-10-CM | POA: Diagnosis not present

## 2019-08-21 DIAGNOSIS — I10 Essential (primary) hypertension: Secondary | ICD-10-CM | POA: Diagnosis not present

## 2019-08-21 DIAGNOSIS — E1169 Type 2 diabetes mellitus with other specified complication: Secondary | ICD-10-CM | POA: Diagnosis not present

## 2019-09-18 DIAGNOSIS — E1169 Type 2 diabetes mellitus with other specified complication: Secondary | ICD-10-CM | POA: Diagnosis not present

## 2019-09-18 DIAGNOSIS — E7800 Pure hypercholesterolemia, unspecified: Secondary | ICD-10-CM | POA: Diagnosis not present

## 2019-09-18 DIAGNOSIS — M8580 Other specified disorders of bone density and structure, unspecified site: Secondary | ICD-10-CM | POA: Diagnosis not present

## 2019-09-18 DIAGNOSIS — I10 Essential (primary) hypertension: Secondary | ICD-10-CM | POA: Diagnosis not present

## 2019-12-12 DIAGNOSIS — E1169 Type 2 diabetes mellitus with other specified complication: Secondary | ICD-10-CM | POA: Diagnosis not present

## 2019-12-12 DIAGNOSIS — M8580 Other specified disorders of bone density and structure, unspecified site: Secondary | ICD-10-CM | POA: Diagnosis not present

## 2019-12-12 DIAGNOSIS — E7800 Pure hypercholesterolemia, unspecified: Secondary | ICD-10-CM | POA: Diagnosis not present

## 2019-12-12 DIAGNOSIS — I10 Essential (primary) hypertension: Secondary | ICD-10-CM | POA: Diagnosis not present

## 2020-01-17 DIAGNOSIS — N3000 Acute cystitis without hematuria: Secondary | ICD-10-CM | POA: Diagnosis not present

## 2020-03-18 DIAGNOSIS — I10 Essential (primary) hypertension: Secondary | ICD-10-CM | POA: Diagnosis not present

## 2020-03-18 DIAGNOSIS — E1169 Type 2 diabetes mellitus with other specified complication: Secondary | ICD-10-CM | POA: Diagnosis not present

## 2020-03-18 DIAGNOSIS — E7800 Pure hypercholesterolemia, unspecified: Secondary | ICD-10-CM | POA: Diagnosis not present

## 2020-03-18 DIAGNOSIS — M8580 Other specified disorders of bone density and structure, unspecified site: Secondary | ICD-10-CM | POA: Diagnosis not present

## 2020-05-09 DIAGNOSIS — E7800 Pure hypercholesterolemia, unspecified: Secondary | ICD-10-CM | POA: Diagnosis not present

## 2020-05-09 DIAGNOSIS — I10 Essential (primary) hypertension: Secondary | ICD-10-CM | POA: Diagnosis not present

## 2020-05-09 DIAGNOSIS — M8580 Other specified disorders of bone density and structure, unspecified site: Secondary | ICD-10-CM | POA: Diagnosis not present

## 2020-05-09 DIAGNOSIS — E1169 Type 2 diabetes mellitus with other specified complication: Secondary | ICD-10-CM | POA: Diagnosis not present

## 2020-06-12 DIAGNOSIS — I10 Essential (primary) hypertension: Secondary | ICD-10-CM | POA: Diagnosis not present

## 2020-06-12 DIAGNOSIS — E1169 Type 2 diabetes mellitus with other specified complication: Secondary | ICD-10-CM | POA: Diagnosis not present

## 2020-06-12 DIAGNOSIS — M8580 Other specified disorders of bone density and structure, unspecified site: Secondary | ICD-10-CM | POA: Diagnosis not present

## 2020-06-12 DIAGNOSIS — E7800 Pure hypercholesterolemia, unspecified: Secondary | ICD-10-CM | POA: Diagnosis not present

## 2020-07-05 DIAGNOSIS — I10 Essential (primary) hypertension: Secondary | ICD-10-CM | POA: Diagnosis not present

## 2020-07-05 DIAGNOSIS — M8580 Other specified disorders of bone density and structure, unspecified site: Secondary | ICD-10-CM | POA: Diagnosis not present

## 2020-07-05 DIAGNOSIS — E1169 Type 2 diabetes mellitus with other specified complication: Secondary | ICD-10-CM | POA: Diagnosis not present

## 2020-07-05 DIAGNOSIS — E7800 Pure hypercholesterolemia, unspecified: Secondary | ICD-10-CM | POA: Diagnosis not present

## 2020-07-15 DIAGNOSIS — Z1231 Encounter for screening mammogram for malignant neoplasm of breast: Secondary | ICD-10-CM | POA: Diagnosis not present

## 2020-08-21 DIAGNOSIS — E1169 Type 2 diabetes mellitus with other specified complication: Secondary | ICD-10-CM | POA: Diagnosis not present

## 2020-08-21 DIAGNOSIS — M8580 Other specified disorders of bone density and structure, unspecified site: Secondary | ICD-10-CM | POA: Diagnosis not present

## 2020-08-21 DIAGNOSIS — I10 Essential (primary) hypertension: Secondary | ICD-10-CM | POA: Diagnosis not present

## 2020-08-21 DIAGNOSIS — E7800 Pure hypercholesterolemia, unspecified: Secondary | ICD-10-CM | POA: Diagnosis not present

## 2020-09-09 DIAGNOSIS — I10 Essential (primary) hypertension: Secondary | ICD-10-CM | POA: Diagnosis not present

## 2020-09-09 DIAGNOSIS — E1169 Type 2 diabetes mellitus with other specified complication: Secondary | ICD-10-CM | POA: Diagnosis not present

## 2020-09-09 DIAGNOSIS — M8580 Other specified disorders of bone density and structure, unspecified site: Secondary | ICD-10-CM | POA: Diagnosis not present

## 2020-09-09 DIAGNOSIS — E7800 Pure hypercholesterolemia, unspecified: Secondary | ICD-10-CM | POA: Diagnosis not present

## 2020-12-02 DIAGNOSIS — M79605 Pain in left leg: Secondary | ICD-10-CM | POA: Diagnosis not present

## 2020-12-02 DIAGNOSIS — M5459 Other low back pain: Secondary | ICD-10-CM | POA: Diagnosis not present

## 2020-12-03 DIAGNOSIS — H2513 Age-related nuclear cataract, bilateral: Secondary | ICD-10-CM | POA: Diagnosis not present

## 2020-12-06 DIAGNOSIS — M545 Low back pain, unspecified: Secondary | ICD-10-CM | POA: Diagnosis not present

## 2020-12-09 DIAGNOSIS — E1169 Type 2 diabetes mellitus with other specified complication: Secondary | ICD-10-CM | POA: Diagnosis not present

## 2020-12-09 DIAGNOSIS — E7800 Pure hypercholesterolemia, unspecified: Secondary | ICD-10-CM | POA: Diagnosis not present

## 2020-12-09 DIAGNOSIS — I10 Essential (primary) hypertension: Secondary | ICD-10-CM | POA: Diagnosis not present

## 2020-12-09 DIAGNOSIS — M8580 Other specified disorders of bone density and structure, unspecified site: Secondary | ICD-10-CM | POA: Diagnosis not present

## 2020-12-11 DIAGNOSIS — E1169 Type 2 diabetes mellitus with other specified complication: Secondary | ICD-10-CM | POA: Diagnosis not present

## 2020-12-11 DIAGNOSIS — M5459 Other low back pain: Secondary | ICD-10-CM | POA: Diagnosis not present

## 2021-01-02 DIAGNOSIS — M5416 Radiculopathy, lumbar region: Secondary | ICD-10-CM | POA: Diagnosis not present

## 2021-01-14 DIAGNOSIS — E7800 Pure hypercholesterolemia, unspecified: Secondary | ICD-10-CM | POA: Diagnosis not present

## 2021-01-14 DIAGNOSIS — M8580 Other specified disorders of bone density and structure, unspecified site: Secondary | ICD-10-CM | POA: Diagnosis not present

## 2021-01-14 DIAGNOSIS — E1169 Type 2 diabetes mellitus with other specified complication: Secondary | ICD-10-CM | POA: Diagnosis not present

## 2021-01-14 DIAGNOSIS — I700 Atherosclerosis of aorta: Secondary | ICD-10-CM | POA: Diagnosis not present

## 2021-01-14 DIAGNOSIS — M79605 Pain in left leg: Secondary | ICD-10-CM | POA: Diagnosis not present

## 2021-01-14 DIAGNOSIS — I10 Essential (primary) hypertension: Secondary | ICD-10-CM | POA: Diagnosis not present

## 2021-01-14 DIAGNOSIS — Z79899 Other long term (current) drug therapy: Secondary | ICD-10-CM | POA: Diagnosis not present

## 2021-01-14 DIAGNOSIS — Z0000 Encounter for general adult medical examination without abnormal findings: Secondary | ICD-10-CM | POA: Diagnosis not present

## 2021-01-16 DIAGNOSIS — M545 Low back pain, unspecified: Secondary | ICD-10-CM | POA: Diagnosis not present

## 2021-02-11 DIAGNOSIS — M5136 Other intervertebral disc degeneration, lumbar region: Secondary | ICD-10-CM | POA: Diagnosis not present

## 2021-02-19 DIAGNOSIS — M5416 Radiculopathy, lumbar region: Secondary | ICD-10-CM | POA: Diagnosis not present

## 2021-03-11 DIAGNOSIS — M5416 Radiculopathy, lumbar region: Secondary | ICD-10-CM | POA: Diagnosis not present

## 2021-03-12 DIAGNOSIS — E7800 Pure hypercholesterolemia, unspecified: Secondary | ICD-10-CM | POA: Diagnosis not present

## 2021-03-12 DIAGNOSIS — M8580 Other specified disorders of bone density and structure, unspecified site: Secondary | ICD-10-CM | POA: Diagnosis not present

## 2021-03-12 DIAGNOSIS — I10 Essential (primary) hypertension: Secondary | ICD-10-CM | POA: Diagnosis not present

## 2021-03-12 DIAGNOSIS — E1169 Type 2 diabetes mellitus with other specified complication: Secondary | ICD-10-CM | POA: Diagnosis not present

## 2021-03-13 DIAGNOSIS — M5416 Radiculopathy, lumbar region: Secondary | ICD-10-CM | POA: Diagnosis not present

## 2021-03-17 DIAGNOSIS — M5136 Other intervertebral disc degeneration, lumbar region: Secondary | ICD-10-CM | POA: Diagnosis not present

## 2021-03-17 DIAGNOSIS — M48061 Spinal stenosis, lumbar region without neurogenic claudication: Secondary | ICD-10-CM | POA: Diagnosis not present

## 2021-03-19 DIAGNOSIS — M5416 Radiculopathy, lumbar region: Secondary | ICD-10-CM | POA: Diagnosis not present

## 2021-03-19 DIAGNOSIS — I10 Essential (primary) hypertension: Secondary | ICD-10-CM | POA: Diagnosis not present

## 2021-03-19 DIAGNOSIS — Z79899 Other long term (current) drug therapy: Secondary | ICD-10-CM | POA: Diagnosis not present

## 2021-03-19 DIAGNOSIS — E7800 Pure hypercholesterolemia, unspecified: Secondary | ICD-10-CM | POA: Diagnosis not present

## 2021-03-19 DIAGNOSIS — E1169 Type 2 diabetes mellitus with other specified complication: Secondary | ICD-10-CM | POA: Diagnosis not present

## 2021-03-25 DIAGNOSIS — M5416 Radiculopathy, lumbar region: Secondary | ICD-10-CM | POA: Diagnosis not present

## 2021-03-27 DIAGNOSIS — M5416 Radiculopathy, lumbar region: Secondary | ICD-10-CM | POA: Diagnosis not present

## 2021-04-15 DIAGNOSIS — E1169 Type 2 diabetes mellitus with other specified complication: Secondary | ICD-10-CM | POA: Diagnosis not present

## 2021-04-15 DIAGNOSIS — E7800 Pure hypercholesterolemia, unspecified: Secondary | ICD-10-CM | POA: Diagnosis not present

## 2021-04-15 DIAGNOSIS — I10 Essential (primary) hypertension: Secondary | ICD-10-CM | POA: Diagnosis not present

## 2021-04-15 DIAGNOSIS — M8580 Other specified disorders of bone density and structure, unspecified site: Secondary | ICD-10-CM | POA: Diagnosis not present

## 2021-05-23 DIAGNOSIS — M5416 Radiculopathy, lumbar region: Secondary | ICD-10-CM | POA: Diagnosis not present

## 2021-05-23 DIAGNOSIS — Z6830 Body mass index (BMI) 30.0-30.9, adult: Secondary | ICD-10-CM | POA: Diagnosis not present

## 2021-05-28 ENCOUNTER — Other Ambulatory Visit: Payer: Self-pay | Admitting: Neurological Surgery

## 2021-06-06 DIAGNOSIS — E7800 Pure hypercholesterolemia, unspecified: Secondary | ICD-10-CM | POA: Diagnosis not present

## 2021-06-06 DIAGNOSIS — E1169 Type 2 diabetes mellitus with other specified complication: Secondary | ICD-10-CM | POA: Diagnosis not present

## 2021-06-06 DIAGNOSIS — M8580 Other specified disorders of bone density and structure, unspecified site: Secondary | ICD-10-CM | POA: Diagnosis not present

## 2021-06-06 DIAGNOSIS — I10 Essential (primary) hypertension: Secondary | ICD-10-CM | POA: Diagnosis not present

## 2021-06-13 DIAGNOSIS — I10 Essential (primary) hypertension: Secondary | ICD-10-CM | POA: Diagnosis not present

## 2021-06-13 DIAGNOSIS — E1169 Type 2 diabetes mellitus with other specified complication: Secondary | ICD-10-CM | POA: Diagnosis not present

## 2021-07-21 DIAGNOSIS — Z1231 Encounter for screening mammogram for malignant neoplasm of breast: Secondary | ICD-10-CM | POA: Diagnosis not present

## 2021-07-29 ENCOUNTER — Encounter (HOSPITAL_COMMUNITY): Admission: RE | Payer: Self-pay | Source: Home / Self Care

## 2021-07-29 ENCOUNTER — Inpatient Hospital Stay (HOSPITAL_COMMUNITY): Admission: RE | Admit: 2021-07-29 | Payer: PPO | Source: Home / Self Care | Admitting: Neurological Surgery

## 2021-07-29 SURGERY — MINIMALLY INVASIVE (MIS) TRANSFORAMINAL LUMBAR INTERBODY FUSION (TLIF) 1 LEVEL
Anesthesia: General | Laterality: Left

## 2021-09-05 DIAGNOSIS — I10 Essential (primary) hypertension: Secondary | ICD-10-CM | POA: Diagnosis not present

## 2021-09-05 DIAGNOSIS — M8580 Other specified disorders of bone density and structure, unspecified site: Secondary | ICD-10-CM | POA: Diagnosis not present

## 2021-09-05 DIAGNOSIS — E7800 Pure hypercholesterolemia, unspecified: Secondary | ICD-10-CM | POA: Diagnosis not present

## 2021-09-05 DIAGNOSIS — E1169 Type 2 diabetes mellitus with other specified complication: Secondary | ICD-10-CM | POA: Diagnosis not present

## 2021-10-07 ENCOUNTER — Other Ambulatory Visit: Payer: Self-pay | Admitting: Neurological Surgery

## 2021-10-17 NOTE — Progress Notes (Signed)
Surgical Instructions    Your procedure is scheduled on Thursday, February 23rd, 2023.   Report to [facility] Main Entrance "A" at 05:30 A.M., then check in with the Admitting office.  Call this number if you have problems the morning of surgery:  [phone]   If you have any questions prior to your surgery date call [phone]: Open Monday-Friday 8am-4pm    Remember:  Do not eat after midnight the night before your surgery  You may drink clear liquids until 04:30 the morning of your surgery.   Clear liquids allowed are: Water, Non-Citrus Juices (without pulp), Carbonated Beverages, Clear Tea, Black Coffee ONLY (NO MILK, CREAM OR POWDERED CREAMER of any kind), and Gatorade    Take these medicines the morning of surgery with A SIP OF WATER:   amLODipine (NORVASC) atorvastatin (LIPITOR)   Follow your surgeon's instructions on when to stop Aspirin.  If no instructions were given by your surgeon then you will need to call the office to get those instructions.     As of today, STOP taking any Aspirin (unless otherwise instructed by your surgeon) Aleve, Naproxen, Ibuprofen, Motrin, Advil, Goody's, BC's, all herbal medications, fish oil, and all vitamins.    The day of surgery:  Do not wear jewelry or makeup Do not wear lotions, powders, perfumes, or deodorant. Do not shave 48 hours prior to surgery.   Do not bring valuables to the hospital. Do not wear nail polish, gel polish, artificial nails, or any other type of covering on natural nails (fingers and toes) If you have artificial nails or gel coating that need to be removed by a nail salon, please have this removed prior to surgery. Artificial nails or gel coating may interfere with anesthesia's ability to adequately monitor your vital signs.   [facility] is not responsible for any belongings or valuables. .   Do NOT Smoke (Tobacco/Vaping)  24 hours prior to your procedure  If you use a CPAP at night, you may bring your  mask for your overnight stay.   Contacts, glasses, hearing aids, dentures or partials may not be worn into surgery, please bring cases for these belongings   For patients admitted to the hospital, discharge time will be determined by your treatment team.   Patients discharged the day of surgery will not be allowed to drive home, and someone needs to stay with them for 24 hours.  NO VISITORS WILL BE ALLOWED IN PRE-OP WHERE PATIENTS ARE PREPPED FOR SURGERY.  ONLY 1 SUPPORT PERSON MAY BE PRESENT IN THE WAITING ROOM WHILE YOU ARE IN SURGERY.  IF YOU ARE TO BE ADMITTED, ONCE YOU ARE IN YOUR ROOM YOU WILL BE ALLOWED TWO (2) VISITORS. 1 (ONE) VISITOR MAY STAY OVERNIGHT BUT MUST ARRIVE TO THE ROOM BY 8pm.  Minor children may have two parents present. Special consideration for safety and communication needs will be reviewed on a case by case basis.  Special instructions:    Oral Hygiene is also important to reduce your risk of infection.  Remember - BRUSH YOUR TEETH THE MORNING OF SURGERY WITH YOUR REGULAR TOOTHPASTE   [place]- Preparing For Surgery  Before surgery, you can play an important role. Because skin is not sterile, your skin needs to be as free of germs as possible. You can reduce the number of germs on your skin by washing with CHG (chlorahexidine gluconate) Soap before surgery.  CHG is an antiseptic cleaner which kills germs and bonds with the skin to continue  killing germs even after washing.     Please do not use if you have an allergy to CHG or antibacterial soaps. If your skin becomes reddened/irritated stop using the CHG.  Do not shave (including legs and underarms) for at least 48 hours prior to first CHG shower. It is OK to shave your face.  Please follow these instructions carefully.     Shower the NIGHT BEFORE SURGERY and the MORNING OF SURGERY with CHG Soap.   If you chose to wash your hair, wash your hair first as usual with your normal shampoo. After you shampoo, rinse  your hair and body thoroughly to remove the shampoo.  Then [employer] and genitals (private parts) with your normal soap and rinse thoroughly to remove soap.  After that Use CHG Soap as you would any other liquid soap. You can apply CHG directly to the skin and wash gently with a scrungie or a clean washcloth.   Apply the CHG Soap to your body ONLY FROM THE NECK DOWN.  Do not use on open wounds or open sores. Avoid contact with your eyes, ears, mouth and genitals (private parts). Wash Face and genitals (private parts)  with your normal soap.   Wash thoroughly, paying special attention to the area where your surgery will be performed.  Thoroughly rinse your body with warm water from the neck down.  DO NOT shower/wash with your normal soap after using and rinsing off the CHG Soap.  Pat yourself dry with a CLEAN TOWEL.  Wear CLEAN PAJAMAS to bed the night before surgery  Place CLEAN SHEETS on your bed the night before your surgery  DO NOT SLEEP WITH PETS.   Day of Surgery:  Take a shower with CHG soap. Wear Clean/Comfortable clothing the morning of surgery Do not apply any deodorants/lotions.   Remember to brush your teeth WITH YOUR REGULAR TOOTHPASTE.    COVID testing  If you are going to stay overnight or be admitted after your procedure/surgery and require a pre-op COVID test, please follow these instructions after your COVID test   You are not required to quarantine however you are required to wear a well-fitting mask when you are out and around people not in your household.  If your mask becomes wet or soiled, replace with a new one.  Wash your hands often with soap and water for 20 seconds or clean your hands with an alcohol-based hand sanitizer that contains at least 60% alcohol.  Do not share personal items.  Notify your provider: if you are in close contact with someone who has COVID  or if you develop a fever of 100.4 or greater, sneezing, cough, sore throat, shortness  of breath or body aches.    Please read over the following fact sheets that you were given.

## 2021-10-20 ENCOUNTER — Other Ambulatory Visit: Payer: Self-pay

## 2021-10-20 ENCOUNTER — Encounter (HOSPITAL_COMMUNITY): Payer: Self-pay

## 2021-10-20 ENCOUNTER — Encounter (HOSPITAL_COMMUNITY)
Admission: RE | Admit: 2021-10-20 | Discharge: 2021-10-20 | Disposition: A | Discharge status: Home / Self Care | Payer: PPO | Source: Ambulatory Visit | Attending: Neurological Surgery | Admitting: Neurological Surgery

## 2021-10-20 VITALS — BP 144/66 | HR 70 | Temp 98.1°F | Resp 18 | Ht 63.0 in | Wt 166.3 lb

## 2021-10-20 DIAGNOSIS — I10 Essential (primary) hypertension: Secondary | ICD-10-CM | POA: Diagnosis not present

## 2021-10-20 DIAGNOSIS — Z01818 Encounter for other preprocedural examination: Secondary | ICD-10-CM | POA: Insufficient documentation

## 2021-10-20 DIAGNOSIS — I8393 Asymptomatic varicose veins of bilateral lower extremities: Secondary | ICD-10-CM | POA: Diagnosis not present

## 2021-10-20 DIAGNOSIS — K5792 Diverticulitis of intestine, part unspecified, without perforation or abscess without bleeding: Secondary | ICD-10-CM | POA: Insufficient documentation

## 2021-10-20 DIAGNOSIS — I447 Left bundle-branch block, unspecified: Secondary | ICD-10-CM | POA: Insufficient documentation

## 2021-10-20 DIAGNOSIS — I2510 Atherosclerotic heart disease of native coronary artery without angina pectoris: Secondary | ICD-10-CM | POA: Insufficient documentation

## 2021-10-20 DIAGNOSIS — U071 COVID-19: Secondary | ICD-10-CM | POA: Insufficient documentation

## 2021-10-20 DIAGNOSIS — M5416 Radiculopathy, lumbar region: Secondary | ICD-10-CM | POA: Insufficient documentation

## 2021-10-20 DIAGNOSIS — E785 Hyperlipidemia, unspecified: Secondary | ICD-10-CM | POA: Diagnosis not present

## 2021-10-20 DIAGNOSIS — E119 Type 2 diabetes mellitus without complications: Secondary | ICD-10-CM | POA: Diagnosis not present

## 2021-10-20 DIAGNOSIS — Z90710 Acquired absence of both cervix and uterus: Secondary | ICD-10-CM | POA: Diagnosis not present

## 2021-10-20 HISTORY — DX: COVID-19: U07.1

## 2021-10-20 HISTORY — DX: Diverticulitis of intestine, part unspecified, without perforation or abscess without bleeding: K57.92

## 2021-10-20 HISTORY — DX: Essential (primary) hypertension: I10

## 2021-10-20 LAB — SURGICAL PCR SCREEN
MRSA, PCR: POSITIVE — AB
Staphylococcus aureus: POSITIVE — AB

## 2021-10-20 LAB — TYPE AND SCREEN
ABO/RH(D): O POS
Antibody Screen: NEGATIVE

## 2021-10-20 LAB — CBC
HCT: 39.3 % (ref 36.0–46.0)
Hemoglobin: 12.9 g/dL (ref 12.0–15.0)
MCH: 31.1 pg (ref 26.0–34.0)
MCHC: 32.8 g/dL (ref 30.0–36.0)
MCV: 94.7 fL (ref 80.0–100.0)
Platelets: 234 10*3/uL (ref 150–400)
RBC: 4.15 MIL/uL (ref 3.87–5.11)
RDW: 14.2 % (ref 11.5–15.5)
WBC: 5.5 10*3/uL (ref 4.0–10.5)
nRBC: 0 % (ref 0.0–0.2)

## 2021-10-20 LAB — BASIC METABOLIC PANEL
Anion gap: 11 (ref 5–15)
BUN: 15 mg/dL (ref 8–23)
CO2: 26 mmol/L (ref 22–32)
Calcium: 9.6 mg/dL (ref 8.9–10.3)
Chloride: 97 mmol/L — ABNORMAL LOW (ref 98–111)
Creatinine, Ser: 0.7 mg/dL (ref 0.44–1.00)
GFR, Estimated: >60 mL/min (ref >60)
Glucose, Bld: 153 mg/dL — ABNORMAL HIGH (ref 70–99)
Potassium: 3.6 mmol/L (ref 3.5–5.1)
Sodium: 134 mmol/L — ABNORMAL LOW (ref 135–145)

## 2021-10-20 LAB — GLUCOSE, CAPILLARY: Glucose-Capillary: 141 mg/dL — ABNORMAL HIGH (ref 70–99)

## 2021-10-20 LAB — HEMOGLOBIN A1C
Hgb A1c MFr Bld: 8.7 % — ABNORMAL HIGH (ref 4.8–5.6)
Mean Plasma Glucose: 203 mg/dL

## 2021-10-20 LAB — SARS CORONAVIRUS 2 (TAT 6-24 HRS): SARS Coronavirus 2: POSITIVE — AB

## 2021-10-20 NOTE — Progress Notes (Signed)
Surgical Instructions                 Your procedure is scheduled on Thursday, February 23rd, 2023.               Report to [facility] Main Entrance "A" at 05:30 A.M., then check in with the Admitting office.             Call this number if you have problems the morning of surgery:             [phone]    If you have any questions prior to your surgery date call [phone]: Open Monday-Friday 8am-4pm               Remember:             Do not eat after midnight the night before your surgery   You may drink clear liquids until 04:30 the morning of your surgery.   Clear liquids allowed are: Water, Non-Citrus Juices (without pulp), Carbonated Beverages, Clear Tea, Black Coffee ONLY (NO MILK, CREAM OR POWDERED CREAMER of any kind), and Gatorade                          Take these medicines the morning of surgery with A SIP OF WATER:    amLODipine (NORVASC) atorvastatin (LIPITOR)    Follow your surgeon's instructions on when to stop Aspirin.  If no instructions were given by your surgeon then you will need to call the office to get those instructions.      As of today, STOP taking any Aspirin (unless otherwise instructed by your surgeon) Aleve, Naproxen, Ibuprofen, Motrin, Advil, Goody's, BC's, all herbal medications, fish oil, and all vitamins.    How to Manage Your Diabetes Before and After Surgery  Why is it important to control my blood sugar before and after surgery? Improving blood sugar levels before and after surgery helps healing and can limit problems. A way of improving blood sugar control is eating a healthy diet by:  Eating less sugar and carbohydrates  Increasing activity/exercise  Talking with your doctor about reaching your blood sugar goals High blood sugars (greater than 180 mg/dL) can raise your risk of infections and slow your recovery, so you will need to focus on controlling your diabetes during the weeks before surgery. Make sure that the doctor who takes  care of your diabetes knows about your planned surgery including the date and location.  How do I manage my blood sugar before surgery? Check your blood sugar at least 4 times a day, starting 2 days before surgery, to make sure that the level is not too high or low. Check your blood sugar the morning of your surgery when you wake up and every 2 hours until you get to the Short Stay unit. If your blood sugar is less than 70 mg/dL, you will need to treat for low blood sugar: Do not take insulin. Treat a low blood sugar (less than 70 mg/dL) with  cup of clear juice (cranberry or apple), 4 glucose tablets, OR glucose gel. Recheck blood sugar in 15 minutes after treatment (to make sure it is greater than 70 mg/dL). If your blood sugar is not greater than 70 mg/dL on recheck, call [phone]  for further instructions. Report your blood sugar to the short stay nurse when you get to Short Stay.  If you are admitted to the hospital after surgery: Your blood sugar  will be checked by the staff and you will probably be given insulin after surgery (instead of oral diabetes medicines) to make sure you have good blood sugar levels. The goal for blood sugar control after surgery is 80-180 mg/dL   If your CBG is greater than 220 mg/dL, inform the staff upon arrival to Short Stay.   Reviewed and Endorsed by [facility] Patient Education Committee, August 2015               The day of surgery:   Do not wear jewelry or makeup Do not wear lotions, powders, perfumes, or deodorant. Do not shave 48 hours prior to surgery.   Do not bring valuables to the hospital. Do not wear nail polish, gel polish, artificial nails, or any other type of covering on natural nails (fingers and toes) If you have artificial nails or gel coating that need to be removed by a nail salon, please have this removed prior to surgery. Artificial nails or gel coating may interfere with anesthesia's ability to adequately monitor your  vital signs.     [place] is not responsible for any belongings or valuables. .    Do NOT Smoke (Tobacco/Vaping)  24 hours prior to your procedure   If you use a CPAP at night, you may bring your mask for your overnight stay.   Contacts, glasses, hearing aids, dentures or partials may not be worn into surgery, please bring cases for these belongings   For patients admitted to the hospital, discharge time will be determined by your treatment team.   Patients discharged the day of surgery will not be allowed to drive home, and someone needs to stay with them for 24 hours.   NO VISITORS WILL BE ALLOWED IN PRE-OP WHERE PATIENTS ARE PREPPED FOR SURGERY.  ONLY 1 SUPPORT PERSON MAY BE PRESENT IN THE WAITING ROOM WHILE YOU ARE IN SURGERY.  IF YOU ARE TO BE ADMITTED, ONCE YOU ARE IN YOUR ROOM YOU WILL BE ALLOWED TWO (2) VISITORS. 1 (ONE) VISITOR MAY STAY OVERNIGHT BUT MUST ARRIVE TO THE ROOM BY 8pm.  Minor children may have two parents present. Special consideration for safety and communication needs will be reviewed on a case by case basis.   Special instructions:     Oral Hygiene is also important to reduce your risk of infection.  Remember - BRUSH YOUR TEETH THE MORNING OF SURGERY WITH YOUR REGULAR TOOTHPASTE     [place]- Preparing For Surgery   Before surgery, you can play an important role. Because skin is not sterile, your skin needs to be as free of germs as possible. You can reduce the number of germs on your skin by washing with CHG (chlorahexidine gluconate) Soap before surgery.  CHG is an antiseptic cleaner which kills germs and bonds with the skin to continue killing germs even after washing.       Please do not use if you have an allergy to CHG or antibacterial soaps. If your skin becomes reddened/irritated stop using the CHG.  Do not shave (including legs and underarms) for at least 48 hours prior to first CHG shower. It is OK to shave your face.   Please follow these  instructions carefully.  Shower the NIGHT BEFORE SURGERY and the MORNING OF SURGERY with CHG Soap.  If you chose to wash your hair, wash your hair first as usual with your normal shampoo. After you shampoo, rinse your hair and body thoroughly to remove the shampoo.  Then [employer] and genitals (private parts) with your normal soap and rinse thoroughly to remove soap.   After that Use CHG Soap as you would any other liquid soap. You can apply CHG directly to the skin and wash gently with a scrungie or a clean washcloth.    Apply the CHG Soap to your body ONLY FROM THE NECK DOWN.  Do not use on open wounds or open sores. Avoid contact with your eyes, ears, mouth and genitals (private parts). Wash Face and genitals (private parts)  with your normal soap.    Wash thoroughly, paying special attention to the area where your surgery will be performed.   Thoroughly rinse your body with warm water from the neck down.   DO NOT shower/wash with your normal soap after using and rinsing off the CHG Soap.   Pat yourself dry with a CLEAN TOWEL.   Wear CLEAN PAJAMAS to bed the night before surgery   Place CLEAN SHEETS on your bed the night before your surgery   DO NOT SLEEP WITH PETS.     Day of Surgery:   Take a shower with CHG soap. Wear Clean/Comfortable clothing the morning of surgery Do not apply any deodorants/lotions.   Remember to brush your teeth WITH YOUR REGULAR TOOTHPASTE.       COVID testing   If you are going to stay overnight or be admitted after your procedure/surgery and require a pre-op COVID test, please follow these instructions after your COVID test    You are not required to quarantine however you are required to wear a well-fitting mask when you are out and around people not in your household.  If your mask becomes wet or soiled, replace with a  new one.   Wash your hands often with soap and water for 20 seconds or clean your hands with an alcohol-based hand sanitizer that contains at least 60% alcohol.   Do not share personal items.   Notify your provider: if you are in close contact with someone who has COVID  or if you develop a fever of 100.4 or greater, sneezing, cough, sore throat, shortness of breath or body aches.     Please read over the following fact sheets that you were given.                 Note Details  Author Alphonsus Sias, RN File Time 10/17/2021  9:30 AM  Author Type Registered Nurse Status Signed  Last Editor Alphonsus Sias, [place] # [id number] Admit Date 10/20/2021

## 2021-10-20 NOTE — Progress Notes (Signed)
Called Ziyanna on the after hours line for Dr. Zada Finders with + covid results for this pt. The pt is to have surgery on Thurs. 10/23/21 at [facility] at 0730. The pt has not been notified as there will be pertinent questions the provider needs to answer.   Positive Results for:  Asymptomatic: Procedure postponed and quarantined for 10 days, unless the procedure is urgent. Please follow the facility's protocol regarding pt and family arrival.  Symptomatic: Procedure postponed and quarantined for 14 days.  Hospitalized with Covid: postponed and quarantined  for 21 days.  Immunocompromised: Procedure postponed and quarantine for 20 days.  The pt will not be retested for 90 days from the + result.

## 2021-10-20 NOTE — Progress Notes (Signed)
IB message sent to Dr. Colleen Can office regarding the positive PCR results.

## 2021-10-20 NOTE — Progress Notes (Signed)
PCP - Dr. Felipa Eth  Cardiologist - Denies  EP- Denies  Endocrine- Denies  Pulm- Denies  Chest x-ray - Denies  EKG - 10/20/21  Stress Test - Denies  ECHO - Denies  Cardiac Cath - Denies  AICD- na PM- na LOOP- na  Nerve Stimulator- Denies  Dialysis- Denies  Sleep Study - Denies CPAP - Denies  LABS- 10/20/21: CBC, BMP, COVID, T/S, PCR  ASA- LD- 2/17  ERAS-Yes- no drink, clears until 0430  HA1C- 10/20/21 Fasting Blood Sugar - 40-415 Checks Blood Sugar ___3__ times a day. Pt states she also wears a continuous meter, and she checks the bs more often, but she will not apply another until after her surgery.  Anesthesia- Yes- abnormal EKG  Pt denies having chest pain, sob, or fever at this time. All instructions explained to the pt, with a verbal understanding of the material. Pt agrees to go over the instructions while at home for a better understanding. Pt also instructed to wear a mask and social distance if she goes out after being tested for COVID-19. The opportunity to ask questions was provided.    Coronavirus Screening  Have you experienced the following symptoms:  Cough yes/no: No Fever (>100.58F)  yes/no: No Runny nose yes/no: No Sore throat yes/no: No Difficulty breathing/shortness of breath  yes/no: No  Have you or a family member traveled in the last 14 days and where? yes/no: No   If the patient indicates "YES" to the above questions, their PAT will be rescheduled to limit the exposure to others and, the surgeon will be notified. THE PATIENT WILL NEED TO BE ASYMPTOMATIC FOR 14 DAYS.   If the patient is not experiencing any of these symptoms, the PAT nurse will instruct them to NOT bring anyone with them to their appointment since they may have these symptoms or traveled as well.   Please remind your patients and families that hospital visitation restrictions are in effect and the importance of the restrictions.

## 2021-10-21 NOTE — Progress Notes (Addendum)
Anesthesia Chart Review:  Case: [id number] Date/Time: 11/13/21 0715   Procedure: Left L5-S1 MIS TLIF w/Metrex (Left) - 3C/RM 20   Anesthesia type: General   Pre-op diagnosis: Lumbar radiculopathy   Location: [facility] OR ROOM 21 / [place] OR   Surgeons: Judith Part, MD      Addendum:  Pt saw cardiologist Berniece Salines, DO 11/04/21 for pre-op eval, stress test and echo ordered and done 11/05/21 (see reassuring results below). Dr. Harriet Masson comments on echo results "no need for any further testing at this time."  Willeen Cass, PhD, FNP-BC [facility] Short Stay Surgical Center/Anesthesiology 11/10/2021 11:50 AM    DISCUSSION: Patient is a 71 year old female scheduled for the above procedure.  Surgery was scheduled for surgery on 10/23/21, but her 10/20/21 presurgical COVID-19 test was positive.   Other history includes never smoker, DM2, HTN, HLD, varicose veins (s/p laser ablation, multiple stab phlebectomies bilateral GSV 2015 by Tinnie Gens, MD), diverticulitis, hysterectomy.   A1c 8.7% on 10/20/21, up from 7.5% on 03/19/21 [facility]).   10/20/21 EKG showed SR, LBBB. I contacted [facility] to request a copy of last EKG there. Her 01/14/21 EKG there also showed SR, LBBB with non-specific T wave abnormality. She denied prior stress test, echo, cardiac cath. Discussed with anesthesiologist Annye Asa, MD. Although patient has had a LBBB since at least 2022, she denied prior CV testing. She does have CAD risk and is scheduled for lumbar fusion. Would recommend preoperative cardiology evaluation. I have left a message with Janett Billow at Dr. Colleen Can office.    VS: BP (!) 144/66    Pulse 70    Temp 36.7 C (Oral)    Resp 18    Ht 5\' 3"  (1.6 m)    Wt 75.4 kg    SpO2 100%    BMI 29.46 kg/m    PROVIDERS: Lajean Manes, MD is PCP [facility])   LABS: Preoperative labs noted. See DISCUSSION. (all labs ordered are listed, but only abnormal results are displayed)  Labs Reviewed  SARS CORONAVIRUS 2 (TAT  6-24 HRS) - Abnormal; Notable for the following components:      Result Value   SARS Coronavirus 2 POSITIVE (*)    All other components within normal limits  SURGICAL PCR SCREEN - Abnormal; Notable for the following components:   MRSA, PCR POSITIVE (*)    Staphylococcus aureus POSITIVE (*)    All other components within normal limits  GLUCOSE, CAPILLARY - Abnormal; Notable for the following components:   Glucose-Capillary 141 (*)    All other components within normal limits  HEMOGLOBIN A1C - Abnormal; Notable for the following components:   Hgb A1c MFr Bld 8.7 (*)    All other components within normal limits  BASIC METABOLIC PANEL - Abnormal; Notable for the following components:   Sodium 134 (*)    Chloride 97 (*)    Glucose, Bld 153 (*)    All other components within normal limits  CBC  TYPE AND SCREEN     IMAGES: MRI L-spine 12/06/20 images in [facility]/PACS.   EKG: 10/20/21: Normal sinus rhythm Left bundle branch block Abnormal ECG No previous ECGs available in [facility] or [facility] - Comparison EKG from [facility]   CV: Denied prior cardiac cath.  Echo 11/05/21:  1. Left ventricular ejection fraction, by estimation, is 55 to 60%. The left ventricle has normal function. The left ventricle has no regional wall motion abnormalities. Left ventricular diastolic parameters are consistent with Grade I diastolic dysfunction (impaired relaxation).  2. Right ventricular systolic function is normal. The right ventricular size is normal.   3. Left atrial size was mildly dilated.   4. The mitral valve is normal in structure. Trivial mitral valve regurgitation. No evidence of mitral stenosis.   5. The aortic valve is tricuspid. There is mild calcification of the aortic valve. Aortic valve regurgitation is mild. Aortic valve sclerosis is present, with no evidence of aortic valve stenosis.   6. The inferior vena cava is normal in size with greater than 50% respiratory variability, suggesting  right atrial pressure of 3 mmHg.   Nuclear stress test 11/05/21:    The study is normal. The study is low risk.   No ST deviation was noted.   LV perfusion is abnormal. Defect 1: There is a small defect with mild reduction in uptake present in the mid anteroseptal location(s) that is reversible. There is normal wall motion in the defect area. Consistent with artifact caused by left bundle branch block.   Left ventricular function is abnormal. Global function is mildly reduced. End diastolic cavity size is normal.   Past Medical History:  Diagnosis Date   Diabetes mellitus without complication (HCC)    Type II, controls with diet   Diverticulitis    Hyperlipidemia    Hypertension    Varicose veins     Past Surgical History:  Procedure Laterality Date   ABDOMINAL HYSTERECTOMY      MEDICATIONS:  amLODipine (NORVASC) 5 MG tablet   aspirin 81 MG EC tablet   atorvastatin (LIPITOR) 10 MG tablet   cholecalciferol (VITAMIN D) 25 MCG (1000 UNIT) tablet   Cyanocobalamin (VITAMIN B12) 1000 MCG TBCR   gabapentin (NEURONTIN) 300 MG capsule   hydrochlorothiazide (HYDRODIURIL) 12.5 MG tablet   Multiple Vitamins-Minerals (HAIR/SKIN/NAILS/BIOTIN PO)   Multiple Vitamins-Minerals (MULTIVITAMIN WITH MINERALS) tablet   potassium chloride (KLOR-CON) 10 MEQ tablet   valsartan (DIOVAN) 160 MG tablet   No current facility-administered medications for this encounter.  On 10/20/21, reported last ASA on 10/17/21.   Myra Gianotti, PA-C Surgical Short Stay/Anesthesiology [facility] Phone [phone] [facility] Phone [phone] 10/21/2021 4:20 PM

## 2021-10-23 ENCOUNTER — Telehealth: Payer: Self-pay | Admitting: *Deleted

## 2021-10-23 NOTE — Telephone Encounter (Signed)
Pt has been set up as a NEW PT for pre op clearance with Dr. Harriet Masson. I will forward notes to Dr. Harriet Masson for upcoming appt. I will send FYI to surgeon's office pt hs NEW PT APPT 10/27/21.

## 2021-10-23 NOTE — Telephone Encounter (Signed)
° °  Pre-operative Risk Assessment    Patient Name: Carmen Davies  DOB: Dec 30, 1950 MRN: [id number]     PT IS GOING TO NEED A NEW PT APPT. I WILL SEND MESSAGE TO OUR SCHEDULING TEAM AS WELL AS OUR CHART PREP TEAM TO REACH OUT TO THE PT WITH A NEW PT APPT FOR PRE OP. I WILL HAND OFF THE NOTES TO CHART PREP TEAM (ROSE JACOBS, CMA) Request for Surgical Clearance    Procedure:   LEFT L5-S1 MINIMALLY INVASIVE TRANS FORAMINAL LUMBAR  INTERBODY FUSION  Date of Surgery:  Clearance 11/13/21                                 Surgeon:  DR. Emelda Brothers Surgeon's Group or Practice Name:  [facility] Phone number:  [phone] Fax number:  [phone] ATTN: JESSICA   Type of Clearance Requested:   - Medical  - Pharmacy:  Hold Aspirin     Type of Anesthesia:  General    Additional requests/questions:    Jiles Prows   10/23/2021, 8:27 AM

## 2021-10-27 ENCOUNTER — Other Ambulatory Visit: Payer: Self-pay

## 2021-10-27 ENCOUNTER — Ambulatory Visit: Payer: BLUE CROSS/BLUE SHIELD | Admitting: Cardiology

## 2021-11-04 ENCOUNTER — Other Ambulatory Visit: Payer: Self-pay

## 2021-11-04 ENCOUNTER — Encounter: Payer: Self-pay | Admitting: Cardiology

## 2021-11-04 ENCOUNTER — Ambulatory Visit (INDEPENDENT_AMBULATORY_CARE_PROVIDER_SITE_OTHER): Payer: PPO | Admitting: Cardiology

## 2021-11-04 VITALS — BP 150/96 | HR 79 | Ht 63.0 in | Wt 167.4 lb

## 2021-11-04 DIAGNOSIS — I10 Essential (primary) hypertension: Secondary | ICD-10-CM

## 2021-11-04 DIAGNOSIS — R011 Cardiac murmur, unspecified: Secondary | ICD-10-CM | POA: Insufficient documentation

## 2021-11-04 DIAGNOSIS — E1140 Type 2 diabetes mellitus with diabetic neuropathy, unspecified: Secondary | ICD-10-CM | POA: Diagnosis not present

## 2021-11-04 DIAGNOSIS — I447 Left bundle-branch block, unspecified: Secondary | ICD-10-CM | POA: Diagnosis not present

## 2021-11-04 DIAGNOSIS — E782 Mixed hyperlipidemia: Secondary | ICD-10-CM | POA: Insufficient documentation

## 2021-11-04 DIAGNOSIS — Z01810 Encounter for preprocedural cardiovascular examination: Secondary | ICD-10-CM | POA: Diagnosis not present

## 2021-11-04 NOTE — Progress Notes (Signed)
Cardiology Office Note:    Date:  11/04/2021   ID:  Carmen Davies, DOB [birth year], MRN [id number]  PCP:  Lajean Manes, MD  Cardiologist:  Berniece Salines, DO  Electrophysiologist:  None   Referring MD: Judith Part, MD   " I am waiting on the surgery having significant back pain"  History of Present Illness:    Carmen Davies is a 71 y.o. female with a hx of hypertension, hyperlipidemia, diabetes type 2 uncontrolled hemoglobin A1c 8.7 back in February 2023 not on any medications, family history of premature coronary artery disease, gestational hypertension is here today to be evaluated preoperatively.  The patient offers no complaints of shortness of breath or chest pain however her activities have been significant limited by her back pain.  Past Medical History:  Diagnosis Date   Diabetes mellitus without complication ([place])    Type II, controls with diet   Diverticulitis    Hyperlipidemia    Hypertension    Varicose veins     Past Surgical History:  Procedure Laterality Date   ABDOMINAL HYSTERECTOMY      Current Medications: Current Meds  Medication Sig   amLODipine (NORVASC) 5 MG tablet Take 5 mg by mouth daily.   aspirin 81 MG EC tablet Take 81 mg by mouth daily.   atorvastatin (LIPITOR) 10 MG tablet Take 10 mg by mouth daily.   cholecalciferol (VITAMIN D) 25 MCG (1000 UNIT) tablet Take 1,000 Units by mouth daily.   Continuous Blood Gluc Sensor (FREESTYLE LIBRE 14 DAY SENSOR) MISC Apply topically daily.   Cyanocobalamin (VITAMIN B12) 1000 MCG TBCR Take 1,000 mcg by mouth daily.   hydrochlorothiazide (HYDRODIURIL) 12.5 MG tablet Take 12.5 mg by mouth every morning.   Multiple Vitamins-Minerals (HAIR/SKIN/NAILS/BIOTIN PO) Take 1 capsule by mouth daily.   Multiple Vitamins-Minerals (MULTIVITAMIN WITH MINERALS) tablet Take 1 tablet by mouth daily.   potassium chloride (KLOR-CON) 10 MEQ tablet Take 10 mEq by mouth every morning.   valsartan (DIOVAN) 160 MG tablet  Take 160 mg by mouth daily.     Allergies:   Codeine   Social History   Socioeconomic History   Marital status: Married    Spouse name: Not on file   Number of children: Not on file   Years of education: Not on file   Highest education level: Not on file  Occupational History   Not on file  Tobacco Use   Smoking status: Never   Smokeless tobacco: Never  Vaping Use   Vaping Use: Never used  Substance and Sexual Activity   Alcohol use: No   Drug use: No   Sexual activity: Not on file  Other Topics Concern   Not on file  Social History Narrative   Not on file   Social Determinants of Health   Financial Resource Strain: Not on file  Food Insecurity: Not on file  Transportation Needs: Not on file  Physical Activity: Not on file  Stress: Not on file  Social Connections: Not on file     Family History: The patient's family history is not on file.  ROS:   Review of Systems  Constitution: Negative for decreased appetite, fever and weight gain.  HENT: Negative for congestion, ear discharge, hoarse voice and sore throat.   Eyes: Negative for discharge, redness, vision loss in right eye and visual halos.  Cardiovascular: Negative for chest pain, dyspnea on exertion, leg swelling, orthopnea and palpitations.  Respiratory: Negative for cough, hemoptysis, shortness of breath and  snoring.   Endocrine: Negative for heat intolerance and polyphagia.  Hematologic/Lymphatic: Negative for bleeding problem. Does not bruise/bleed easily.  Skin: Negative for flushing, nail changes, rash and suspicious lesions.  Musculoskeletal: Negative for arthritis, joint pain, muscle cramps, myalgias, neck pain and stiffness.  Gastrointestinal: Negative for abdominal pain, bowel incontinence, diarrhea and excessive appetite.  Genitourinary: Negative for decreased libido, genital sores and incomplete emptying.  Neurological: Negative for brief paralysis, focal weakness, headaches and loss of balance.   Psychiatric/Behavioral: Negative for altered mental status, depression and suicidal ideas.  Allergic/Immunologic: Negative for HIV exposure and persistent infections.    EKGs/Labs/Other Studies Reviewed:    The following studies were reviewed today:   EKG:  The ekg ordered today demonstrates sinus rhythm, heart rate 79 bpm with underlying left bundle branch block.  Recent Labs: 10/20/2021: BUN 15; Creatinine, Ser 0.70; Hemoglobin 12.9; Platelets 234; Potassium 3.6; Sodium 134  Recent Lipid Panel    Component Value Date/Time   CHOL 226 (HH) 08/17/2006 1237   TRIG 167 (H) 08/17/2006 1237   HDL 38.3 (L) 08/17/2006 1237   CHOLHDL 5.9 CALC 08/17/2006 1237   VLDL 33 08/17/2006 1237   LDLDIRECT 165.5 08/17/2006 1237    Physical Exam:    VS:  BP (!) 150/96    Pulse 79    Ht '5\' 3"'$  (1.6 m)    Wt 167 lb 6.4 oz (75.9 kg)    SpO2 95%    BMI 29.65 kg/m     Wt Readings from Last 3 Encounters:  11/04/21 167 lb 6.4 oz (75.9 kg)  10/20/21 166 lb 4.8 oz (75.4 kg)  02/26/15 172 lb (78 kg)     GEN: Well nourished, well developed in no acute distress HEENT: Normal NECK: No JVD; No carotid bruits LYMPHATICS: No lymphadenopathy CARDIAC: S1S2 noted,RRR, no murmurs, rubs, gallops RESPIRATORY:  Clear to auscultation without rales, wheezing or rhonchi  ABDOMEN: Soft, non-tender, non-distended, +bowel sounds, no guarding. EXTREMITIES: No edema, No cyanosis, no clubbing MUSCULOSKELETAL:  No deformity  SKIN: Warm and dry NEUROLOGIC:  Alert and oriented x 3, non-focal PSYCHIATRIC:  Normal affect, good insight  ASSESSMENT:    1. Preop cardiovascular exam   2. LBBB (left bundle branch block)   3. Murmur   4. Controlled type 2 diabetes with neuropathy ([place])   5. Primary hypertension   6. Mixed hyperlipidemia    PLAN:    She is here for preoperative visit and she does have risk factors that deems her intermediate to high risk and also the patient does not have significant activity Tesuloid  any symptoms any in the setting of her left bundle branch block I like to proceed with a nuclear stress test prior to her surgery.  In addition her clinical exam is suggesting aortic stenosis or sclerosis murmur and echocardiogram will be appropriate at this time.  We will get the echocardiogram first prior to the [facility].  Her blood pressure is elevated but this appears to be an isolated number as her blood pressure review at her PCP office I was able to go back noting on the K PN June 13, 2021.  Pressure was 118/78.  She tells me this is what her blood pressure usually runs.  We will continue to monitor.  In terms of her diabetes her hemoglobin A1c is 8.7 she tells me that she does not want to take any medication.  I counseled the patient on importance of trying to control her diabetes keeping hemoglobin A1c less than 7.  She will speak with her PCP as she may reconsider medications I explained to her about the various complication for uncontrolled diabetes.  Hyperlipidemia - continue with current statin medication.   The patient is in agreement with the above plan. The patient left the office in stable condition.  The patient will follow up in 6 months or sooner if needed.   Medication Adjustments/Labs and Tests Ordered: Current medicines are reviewed at length with the patient today.  Concerns regarding medicines are outlined above.  Orders Placed This Encounter  Procedures   MYOCARDIAL PERFUSION IMAGING   EKG 12-Lead   ECHOCARDIOGRAM COMPLETE   No orders of the defined types were placed in this encounter.   Patient Instructions  Medication Instructions:  Your physician recommends that you continue on your current medications as directed. Please refer to the Current Medication list given to you today.  *If you need a refill on your cardiac medications before your next appointment, please call your pharmacy*   Lab Work: None If you have labs (blood work) drawn today and your  tests are completely normal, you will receive your results only by: [place] (if you have MyChart) OR A paper copy in the mail If you have any lab test that is abnormal or we need to change your treatment, we will call you to review the results.   Testing/Procedures: Your physician has requested that you have an echocardiogram. Echocardiography is a painless test that uses sound waves to create images of your heart. It provides your doctor with information about the size and shape of your heart and how well your hearts chambers and valves are working. This procedure takes approximately one hour. There are no restrictions for this procedure.  Your physician has requested that you have a lexiscan myoview. For further information please visit [email or web address]. Please follow instruction sheet, as given.   The test will take approximately 3 to 4 hours to complete; you may bring reading material.  If someone comes with you to your appointment, they will need to remain in the main lobby due to limited space in the testing area. **If you are pregnant or breastfeeding, please notify the nuclear lab prior to your appointment**  How to prepare for your Myocardial Perfusion Test: Do not eat or drink 3 hours prior to your test, except you may have water. Do not consume products containing caffeine (regular or decaffeinated) 12 hours prior to your test. (ex: coffee, chocolate, sodas, tea). Do bring a list of your current medications with you.  If not listed below, you may take your medications as normal. Do wear comfortable clothes (no dresses or overalls) and walking shoes, tennis shoes preferred (No heels or open toe shoes are allowed). Do NOT wear cologne, perfume, aftershave, or lotions (deodorant is allowed). If these instructions are not followed, your test will have to be rescheduled.    Follow-Up: At [facility], you and your health needs are our priority.  As part of our continuing  mission to provide you with exceptional heart care, we have created designated Provider Care Teams.  These Care Teams include your primary Cardiologist (physician) and Advanced Practice Providers (APPs -  Physician Assistants and Nurse Practitioners) who all work together to provide you with the care you need, when you need it.  We recommend signing up for the patient portal called "MyChart".  Sign up information is provided on this After Visit Summary.  MyChart is used to connect with patients for Virtual Visits (Telemedicine).  Patients are able to view lab/test results, encounter notes, upcoming appointments, etc.  Non-urgent messages can be sent to your provider as well.   To learn more about what you can do with MyChart, go to [email or web address].    Your next appointment:   6 month(s)  The format for your next appointment:   In Person  Provider:   Berniece Salines, DO      Other Instructions     Adopting a Healthy Lifestyle.  Know what a healthy weight is for you (roughly BMI <25) and aim to maintain this   Aim for 7+ servings of fruits and vegetables daily   65-80+ fluid ounces of water or unsweet tea for healthy kidneys   Limit to max 1 drink of alcohol per day; avoid smoking/tobacco   Limit animal fats in diet for cholesterol and heart health - choose grass fed whenever available   Avoid highly processed foods, and foods high in saturated/trans fats   Aim for low stress - take time to unwind and care for your mental health   Aim for 150 min of moderate intensity exercise weekly for heart health, and weights twice weekly for bone health   Aim for 7-9 hours of sleep daily   When it comes to diets, agreement about the perfect plan isnt easy to find, even among the experts. Experts at the [employer] developed an idea known as the Healthy Eating Plate. Just imagine a plate divided into logical, healthy portions.   The emphasis is on diet quality:    Load up on vegetables and fruits - one-half of your plate: Aim for color and variety, and remember that potatoes dont count.   Go for whole grains - one-quarter of your plate: Whole wheat, barley, wheat berries, quinoa, oats, brown rice, and foods made with them. If you want pasta, go with whole wheat pasta.   Protein power - one-quarter of your plate: Fish, chicken, beans, and nuts are all healthy, versatile protein sources. Limit red meat.   The diet, however, does go beyond the plate, offering a few other suggestions.   Use healthy plant oils, such as olive, canola, soy, corn, sunflower and peanut. Check the labels, and avoid partially hydrogenated oil, which have unhealthy trans fats.   If youre thirsty, drink water. Coffee and tea are good in moderation, but skip sugary drinks and limit milk and dairy products to one or two daily servings.   The type of carbohydrate in the diet is more important than the amount. Some sources of carbohydrates, such as vegetables, fruits, whole grains, and beans-are healthier than others.   Finally, stay active  Signed, Berniece Salines, DO  11/04/2021 12:13 PM    [facility]

## 2021-11-04 NOTE — Patient Instructions (Addendum)
Medication Instructions:  ?Your physician recommends that you continue on your current medications as directed. Please refer to the Current Medication list given to you today.  ?*If you need a refill on your cardiac medications before your next appointment, please call your pharmacy* ? ? ?Lab Work: ?None ?If you have labs (blood work) drawn today and your tests are completely normal, you will receive your results only by: ?MyChart Message (if you have MyChart) OR ?A paper copy in the mail ?If you have any lab test that is abnormal or we need to change your treatment, we will call you to review the results. ? ? ?Testing/Procedures: ?Your physician has requested that you have an echocardiogram. Echocardiography is a painless test that uses sound waves to create images of your heart. It provides your doctor with information about the size and shape of your heart and how well your heart?s chambers and valves are working. This procedure takes approximately one hour. There are no restrictions for this procedure. ? ?Your physician has requested that you have a lexiscan myoview. For further information please visit [email or web address]. Please follow instruction sheet, as given. ? ? ?The test will take approximately 3 to 4 hours to complete; you may bring reading material.  If someone comes with you to your appointment, they will need to remain in the main lobby due to limited space in the testing area. **If you are pregnant or breastfeeding, please notify the nuclear lab prior to your appointment** ? ?How to prepare for your Myocardial Perfusion Test: ?Do not eat or drink 3 hours prior to your test, except you may have water. ?Do not consume products containing caffeine (regular or decaffeinated) 12 hours prior to your test. (ex: coffee, chocolate, sodas, tea). ?Do bring a list of your current medications with you.  If not listed below, you may take your medications as normal. ?Do wear comfortable clothes (no dresses or  overalls) and walking shoes, tennis shoes preferred (No heels or open toe shoes are allowed). ?Do NOT wear cologne, perfume, aftershave, or lotions (deodorant is allowed). ?If these instructions are not followed, your test will have to be rescheduled. ? ? ? ?Follow-Up: ?At [facility], you and your health needs are our priority.  As part of our continuing mission to provide you with exceptional heart care, we have created designated Provider Care Teams.  These Care Teams include your primary Cardiologist (physician) and Advanced Practice Providers (APPs -  Physician Assistants and Nurse Practitioners) who all work together to provide you with the care you need, when you need it. ? ?We recommend signing up for the patient portal called "MyChart".  Sign up information is provided on this After Visit Summary.  MyChart is used to connect with patients for Virtual Visits (Telemedicine).  Patients are able to view lab/test results, encounter notes, upcoming appointments, etc.  Non-urgent messages can be sent to your provider as well.   ?To learn more about what you can do with MyChart, go to [email or web address].   ? ?Your next appointment:   ?6 month(s) ? ?The format for your next appointment:   ?In Person ? ?Provider:   ?Berniece Salines, DO ?   ? ? ?Other Instructions ?  ?

## 2021-11-05 ENCOUNTER — Ambulatory Visit (HOSPITAL_BASED_OUTPATIENT_CLINIC_OR_DEPARTMENT_OTHER): Payer: PPO

## 2021-11-05 ENCOUNTER — Telehealth (HOSPITAL_COMMUNITY): Payer: Self-pay | Admitting: *Deleted

## 2021-11-05 DIAGNOSIS — R011 Cardiac murmur, unspecified: Secondary | ICD-10-CM

## 2021-11-05 DIAGNOSIS — I447 Left bundle-branch block, unspecified: Secondary | ICD-10-CM | POA: Diagnosis not present

## 2021-11-05 LAB — ECHOCARDIOGRAM COMPLETE
Area-P 1/2: 3.98 cm2
S' Lateral: 2.7 cm

## 2021-11-05 MED ORDER — PERFLUTREN LIPID MICROSPHERE
1.0000 mL | INTRAVENOUS | Status: AC | PRN
  Administered 2021-11-05: 2 mL via INTRAVENOUS

## 2021-11-05 NOTE — Telephone Encounter (Signed)
Close encounter 

## 2021-11-06 ENCOUNTER — Other Ambulatory Visit: Payer: Self-pay

## 2021-11-06 ENCOUNTER — Ambulatory Visit (HOSPITAL_COMMUNITY)
Admission: RE | Admit: 2021-11-06 | Discharge: 2021-11-06 | Disposition: A | Discharge status: Home / Self Care | Payer: PPO | Source: Ambulatory Visit | Attending: Internal Medicine | Admitting: Internal Medicine

## 2021-11-06 DIAGNOSIS — I447 Left bundle-branch block, unspecified: Secondary | ICD-10-CM | POA: Insufficient documentation

## 2021-11-06 DIAGNOSIS — R011 Cardiac murmur, unspecified: Secondary | ICD-10-CM | POA: Insufficient documentation

## 2021-11-06 LAB — MYOCARDIAL PERFUSION IMAGING
LV dias vol: 103 mL (ref 46–106)
LV sys vol: 49 mL
Nuc Stress EF: 52 %
Peak HR: 110 {beats}/min
Rest HR: 62 {beats}/min
Rest Nuclear Isotope Dose: 10.9 mCi
SDS: 5
SRS: 0
SSS: 5
ST Depression (mm): 0 mm
Stress Nuclear Isotope Dose: 32.4 mCi
TID: 1.01

## 2021-11-06 MED ORDER — TECHNETIUM TC 99M TETROFOSMIN IV KIT
32.4000 | PACK | Freq: Once | INTRAVENOUS | Status: AC | PRN
  Administered 2021-11-06: 32.4 via INTRAVENOUS
  Filled 2021-11-06: qty 33

## 2021-11-06 MED ORDER — REGADENOSON 0.4 MG/5ML IV SOLN
0.4000 mg | Freq: Once | INTRAVENOUS | Status: AC
  Administered 2021-11-06: 0.4 mg via INTRAVENOUS

## 2021-11-06 MED ORDER — TECHNETIUM TC 99M TETROFOSMIN IV KIT
10.9000 | PACK | Freq: Once | INTRAVENOUS | Status: AC | PRN
  Administered 2021-11-06: 10.9 via INTRAVENOUS
  Filled 2021-11-06: qty 11

## 2021-11-06 MED ORDER — AMINOPHYLLINE 25 MG/ML IV SOLN
75.0000 mg | Freq: Once | INTRAVENOUS | Status: AC
  Administered 2021-11-06: 75 mg via INTRAVENOUS

## 2021-11-10 NOTE — Anesthesia Preprocedure Evaluation (Addendum)
Anesthesia Evaluation  Patient identified by MRN, date of birth, ID band Patient awake    Reviewed: Allergy & Precautions, H&P , NPO status , Patient's Chart, lab work & pertinent test results  Airway Mallampati: II   Neck ROM: full    Dental   Pulmonary neg pulmonary ROS,    breath sounds clear to auscultation       Cardiovascular hypertension,  Rhythm:regular Rate:Normal     Neuro/Psych PSYCHIATRIC DISORDERS Anxiety    GI/Hepatic PUD,   Endo/Other  diabetes, Type 2  Renal/GU      Musculoskeletal  (+) Arthritis ,   Abdominal   Peds  Hematology   Anesthesia Other Findings   Reproductive/Obstetrics                            Anesthesia Physical Anesthesia Plan  ASA: 2  Anesthesia Plan: General   Post-op Pain Management:    Induction: Intravenous  PONV Risk Score and Plan: 3 and Ondansetron, Dexamethasone, Midazolam and Treatment may vary due to age or medical condition  Airway Management Planned: Oral ETT  Additional Equipment:   Intra-op Plan:   Post-operative Plan: Extubation in OR  Informed Consent: I have reviewed the patients History and Physical, chart, labs and discussed the procedure including the risks, benefits and alternatives for the proposed anesthesia with the patient or authorized representative who has indicated his/her understanding and acceptance.     Dental advisory given  Plan Discussed with: CRNA, Anesthesiologist and Surgeon  Anesthesia Plan Comments: (See APP note by Joslyn Hy, FNP )       Anesthesia Quick Evaluation

## 2021-11-12 ENCOUNTER — Encounter (HOSPITAL_COMMUNITY): Payer: Self-pay | Admitting: Neurological Surgery

## 2021-11-12 ENCOUNTER — Other Ambulatory Visit: Payer: Self-pay

## 2021-11-12 NOTE — Progress Notes (Signed)
Pt updated on surgery time and arrival. Pt advised to follow previous pre-op instructions that were given at her pre-op appt on 10/20/21. ?

## 2021-11-13 ENCOUNTER — Ambulatory Visit (HOSPITAL_COMMUNITY): Payer: PPO | Admitting: Vascular Surgery

## 2021-11-13 ENCOUNTER — Ambulatory Visit (HOSPITAL_COMMUNITY): Payer: PPO

## 2021-11-13 ENCOUNTER — Encounter (HOSPITAL_COMMUNITY): Payer: Self-pay | Admitting: Neurological Surgery

## 2021-11-13 ENCOUNTER — Encounter (HOSPITAL_COMMUNITY)
Admission: RE | Disposition: A | Discharge status: Home / Self Care | Payer: Self-pay | Source: Home / Self Care | Attending: Neurological Surgery

## 2021-11-13 ENCOUNTER — Ambulatory Visit (HOSPITAL_BASED_OUTPATIENT_CLINIC_OR_DEPARTMENT_OTHER): Payer: PPO | Admitting: Certified Registered Nurse Anesthetist

## 2021-11-13 ENCOUNTER — Other Ambulatory Visit: Payer: Self-pay

## 2021-11-13 ENCOUNTER — Observation Stay (HOSPITAL_COMMUNITY)
Admission: RE | Admit: 2021-11-13 | Discharge: 2021-11-14 | Disposition: A | Discharge status: Home / Self Care | Payer: PPO | Attending: Neurological Surgery | Admitting: Neurological Surgery

## 2021-11-13 DIAGNOSIS — E119 Type 2 diabetes mellitus without complications: Secondary | ICD-10-CM | POA: Insufficient documentation

## 2021-11-13 DIAGNOSIS — M5416 Radiculopathy, lumbar region: Secondary | ICD-10-CM | POA: Diagnosis not present

## 2021-11-13 DIAGNOSIS — Z8616 Personal history of COVID-19: Secondary | ICD-10-CM | POA: Insufficient documentation

## 2021-11-13 DIAGNOSIS — I10 Essential (primary) hypertension: Secondary | ICD-10-CM | POA: Diagnosis not present

## 2021-11-13 DIAGNOSIS — M48061 Spinal stenosis, lumbar region without neurogenic claudication: Secondary | ICD-10-CM | POA: Diagnosis not present

## 2021-11-13 DIAGNOSIS — M4326 Fusion of spine, lumbar region: Secondary | ICD-10-CM | POA: Diagnosis not present

## 2021-11-13 DIAGNOSIS — Z419 Encounter for procedure for purposes other than remedying health state, unspecified: Secondary | ICD-10-CM

## 2021-11-13 DIAGNOSIS — Z981 Arthrodesis status: Secondary | ICD-10-CM | POA: Diagnosis not present

## 2021-11-13 HISTORY — PX: TRANSFORAMINAL LUMBAR INTERBODY FUSION W/ MIS 1 LEVEL: SHX6145

## 2021-11-13 LAB — GLUCOSE, CAPILLARY
Glucose-Capillary: 161 mg/dL — ABNORMAL HIGH (ref 70–99)
Glucose-Capillary: 166 mg/dL — ABNORMAL HIGH (ref 70–99)
Glucose-Capillary: 210 mg/dL — ABNORMAL HIGH (ref 70–99)
Glucose-Capillary: 252 mg/dL — ABNORMAL HIGH (ref 70–99)

## 2021-11-13 LAB — TYPE AND SCREEN
ABO/RH(D): O POS
Antibody Screen: NEGATIVE

## 2021-11-13 SURGERY — MINIMALLY INVASIVE (MIS) TRANSFORAMINAL LUMBAR INTERBODY FUSION (TLIF) 1 LEVEL
Anesthesia: General | Site: Spine Lumbar | Laterality: Left

## 2021-11-13 MED ORDER — HYDROMORPHONE HCL 1 MG/ML IJ SOLN: 1.0000 mg | INTRAMUSCULAR | Status: DC | PRN

## 2021-11-13 MED ORDER — HYDROXYZINE HCL 50 MG/ML IM SOLN
50.0000 mg | Freq: Four times a day (QID) | INTRAMUSCULAR | Status: DC | PRN
  Administered 2021-11-13: 50 mg via INTRAMUSCULAR
  Filled 2021-11-13: qty 1

## 2021-11-13 MED ORDER — PROPOFOL 10 MG/ML IV BOLUS
INTRAVENOUS | Status: AC
  Filled 2021-11-13: qty 20

## 2021-11-13 MED ORDER — INSULIN ASPART 100 UNIT/ML IJ SOLN
0.0000 [IU] | Freq: Every day | INTRAMUSCULAR | Status: DC
  Administered 2021-11-13: 3 [IU] via SUBCUTANEOUS

## 2021-11-13 MED ORDER — ONDANSETRON HCL 4 MG PO TABS: 4.0000 mg | ORAL_TABLET | Freq: Four times a day (QID) | ORAL | Status: DC | PRN

## 2021-11-13 MED ORDER — INSULIN ASPART 100 UNIT/ML IJ SOLN: 0.0000 [IU] | Freq: Three times a day (TID) | INTRAMUSCULAR | Status: DC

## 2021-11-13 MED ORDER — PHENYLEPHRINE 40 MCG/ML (10ML) SYRINGE FOR IV PUSH (FOR BLOOD PRESSURE SUPPORT)
PREFILLED_SYRINGE | INTRAVENOUS | Status: AC
  Filled 2021-11-13: qty 10

## 2021-11-13 MED ORDER — FREESTYLE LIBRE 14 DAY SENSOR MISC: Freq: Every day | Status: DC

## 2021-11-13 MED ORDER — ACETAMINOPHEN 650 MG RE SUPP
650.0000 mg | RECTAL | Status: DC | PRN
Start: 2021-11-13 — End: 2021-11-14

## 2021-11-13 MED ORDER — OXYCODONE HCL 5 MG PO TABS
5.0000 mg | ORAL_TABLET | ORAL | Status: DC | PRN
  Administered 2021-11-13 – 2021-11-14 (×3): 5 mg via ORAL
  Filled 2021-11-13 (×3): qty 1

## 2021-11-13 MED ORDER — LACTATED RINGERS IV SOLN: INTRAVENOUS | Status: DC

## 2021-11-13 MED ORDER — FENTANYL CITRATE (PF) 250 MCG/5ML IJ SOLN
INTRAMUSCULAR | Status: DC | PRN
  Administered 2021-11-13: 100 ug via INTRAVENOUS

## 2021-11-13 MED ORDER — ONDANSETRON HCL 4 MG/2ML IJ SOLN: 4.0000 mg | Freq: Four times a day (QID) | INTRAMUSCULAR | Status: DC | PRN

## 2021-11-13 MED ORDER — ACETAMINOPHEN 10 MG/ML IV SOLN
INTRAVENOUS | Status: AC
  Filled 2021-11-13: qty 100

## 2021-11-13 MED ORDER — SODIUM CHLORIDE 0.9% FLUSH
3.0000 mL | Freq: Two times a day (BID) | INTRAVENOUS | Status: DC
  Administered 2021-11-13 (×2): 3 mL via INTRAVENOUS

## 2021-11-13 MED ORDER — LIDOCAINE-EPINEPHRINE 1 %-1:100000 IJ SOLN
INTRAMUSCULAR | Status: AC
  Filled 2021-11-13: qty 1

## 2021-11-13 MED ORDER — FENTANYL CITRATE (PF) 250 MCG/5ML IJ SOLN
INTRAMUSCULAR | Status: AC
  Filled 2021-11-13: qty 5

## 2021-11-13 MED ORDER — DOCUSATE SODIUM 100 MG PO CAPS
100.0000 mg | ORAL_CAPSULE | Freq: Two times a day (BID) | ORAL | Status: DC
  Administered 2021-11-13: 100 mg via ORAL
  Filled 2021-11-13: qty 1

## 2021-11-13 MED ORDER — POTASSIUM CHLORIDE ER 10 MEQ PO TBCR
10.0000 meq | EXTENDED_RELEASE_TABLET | Freq: Every morning | ORAL | Status: DC
  Filled 2021-11-13 (×2): qty 1

## 2021-11-13 MED ORDER — 0.9 % SODIUM CHLORIDE (POUR BTL) OPTIME
TOPICAL | Status: DC | PRN
  Administered 2021-11-13: 1000 mL

## 2021-11-13 MED ORDER — ROCURONIUM BROMIDE 10 MG/ML (PF) SYRINGE
PREFILLED_SYRINGE | INTRAVENOUS | Status: DC | PRN
  Administered 2021-11-13: 70 mg via INTRAVENOUS
  Administered 2021-11-13: 20 mg via INTRAVENOUS

## 2021-11-13 MED ORDER — SUCCINYLCHOLINE CHLORIDE 200 MG/10ML IV SOSY
PREFILLED_SYRINGE | INTRAVENOUS | Status: AC
  Filled 2021-11-13: qty 10

## 2021-11-13 MED ORDER — THROMBIN 5000 UNITS EX SOLR: OROMUCOSAL | Status: DC | PRN

## 2021-11-13 MED ORDER — OXYCODONE HCL 5 MG PO TABS: 5.0000 mg | ORAL_TABLET | Freq: Once | ORAL | Status: DC | PRN

## 2021-11-13 MED ORDER — ACETAMINOPHEN 325 MG PO TABS
650.0000 mg | ORAL_TABLET | ORAL | Status: DC | PRN
  Administered 2021-11-13 – 2021-11-14 (×3): 650 mg via ORAL
  Filled 2021-11-13 (×3): qty 2

## 2021-11-13 MED ORDER — DEXAMETHASONE SODIUM PHOSPHATE 10 MG/ML IJ SOLN
INTRAMUSCULAR | Status: AC
  Filled 2021-11-13: qty 1

## 2021-11-13 MED ORDER — PHENYLEPHRINE 40 MCG/ML (10ML) SYRINGE FOR IV PUSH (FOR BLOOD PRESSURE SUPPORT)
PREFILLED_SYRINGE | INTRAVENOUS | Status: DC | PRN
  Administered 2021-11-13: 80 ug via INTRAVENOUS

## 2021-11-13 MED ORDER — ROCURONIUM BROMIDE 10 MG/ML (PF) SYRINGE
PREFILLED_SYRINGE | INTRAVENOUS | Status: AC
  Filled 2021-11-13: qty 10

## 2021-11-13 MED ORDER — MENTHOL 3 MG MT LOZG: 1.0000 | LOZENGE | OROMUCOSAL | Status: DC | PRN

## 2021-11-13 MED ORDER — CHLORHEXIDINE GLUCONATE CLOTH 2 % EX PADS: 6.0000 | MEDICATED_PAD | Freq: Once | CUTANEOUS | Status: DC

## 2021-11-13 MED ORDER — SODIUM CHLORIDE 0.9% FLUSH: 3.0000 mL | INTRAVENOUS | Status: DC | PRN

## 2021-11-13 MED ORDER — LIDOCAINE 2% (20 MG/ML) 5 ML SYRINGE
INTRAMUSCULAR | Status: AC
  Filled 2021-11-13: qty 5

## 2021-11-13 MED ORDER — INSULIN ASPART 100 UNIT/ML IJ SOLN: 0.0000 [IU] | INTRAMUSCULAR | Status: DC | PRN

## 2021-11-13 MED ORDER — OXYCODONE HCL 5 MG PO TABS: 10.0000 mg | ORAL_TABLET | ORAL | Status: DC | PRN

## 2021-11-13 MED ORDER — ATORVASTATIN CALCIUM 10 MG PO TABS: 10.0000 mg | ORAL_TABLET | Freq: Every day | ORAL | Status: DC

## 2021-11-13 MED ORDER — IRBESARTAN 150 MG PO TABS: 150.0000 mg | ORAL_TABLET | Freq: Every day | ORAL | Status: DC

## 2021-11-13 MED ORDER — CHLORHEXIDINE GLUCONATE 0.12 % MT SOLN: 15.0000 mL | Freq: Once | OROMUCOSAL | Status: AC

## 2021-11-13 MED ORDER — CEFAZOLIN SODIUM-DEXTROSE 2-4 GM/100ML-% IV SOLN
INTRAVENOUS | Status: AC
  Filled 2021-11-13: qty 100

## 2021-11-13 MED ORDER — DEXAMETHASONE SODIUM PHOSPHATE 10 MG/ML IJ SOLN
INTRAMUSCULAR | Status: DC | PRN
Start: 2021-11-13 — End: 2021-11-13
  Administered 2021-11-13: 10 mg via INTRAVENOUS

## 2021-11-13 MED ORDER — PHENYLEPHRINE HCL-NACL 20-0.9 MG/250ML-% IV SOLN
INTRAVENOUS | Status: DC | PRN
  Administered 2021-11-13: 20 ug/min via INTRAVENOUS

## 2021-11-13 MED ORDER — PROPOFOL 10 MG/ML IV BOLUS
INTRAVENOUS | Status: DC | PRN
  Administered 2021-11-13: 130 mg via INTRAVENOUS

## 2021-11-13 MED ORDER — CEFAZOLIN SODIUM-DEXTROSE 2-4 GM/100ML-% IV SOLN
2.0000 g | Freq: Three times a day (TID) | INTRAVENOUS | Status: AC
  Administered 2021-11-13 (×2): 2 g via INTRAVENOUS
  Filled 2021-11-13 (×2): qty 100

## 2021-11-13 MED ORDER — SUGAMMADEX SODIUM 200 MG/2ML IV SOLN
INTRAVENOUS | Status: DC | PRN
  Administered 2021-11-13: 200 mg via INTRAVENOUS

## 2021-11-13 MED ORDER — LIDOCAINE 2% (20 MG/ML) 5 ML SYRINGE
INTRAMUSCULAR | Status: DC | PRN
  Administered 2021-11-13: 50 mg via INTRAVENOUS

## 2021-11-13 MED ORDER — THROMBIN 5000 UNITS EX SOLR
CUTANEOUS | Status: AC
  Filled 2021-11-13: qty 5000

## 2021-11-13 MED ORDER — MUPIROCIN 2 % EX OINT
1.0000 "application " | TOPICAL_OINTMENT | Freq: Two times a day (BID) | CUTANEOUS | Status: DC
  Administered 2021-11-13 – 2021-11-14 (×3): 1 via NASAL
  Filled 2021-11-13: qty 22

## 2021-11-13 MED ORDER — AMLODIPINE BESYLATE 5 MG PO TABS: 5.0000 mg | ORAL_TABLET | Freq: Every day | ORAL | Status: DC

## 2021-11-13 MED ORDER — POLYETHYLENE GLYCOL 3350 17 G PO PACK: 17.0000 g | PACK | Freq: Every day | ORAL | Status: DC | PRN

## 2021-11-13 MED ORDER — FENTANYL CITRATE (PF) 100 MCG/2ML IJ SOLN: 25.0000 ug | INTRAMUSCULAR | Status: DC | PRN

## 2021-11-13 MED ORDER — OXYCODONE HCL 5 MG/5ML PO SOLN: 5.0000 mg | Freq: Once | ORAL | Status: DC | PRN

## 2021-11-13 MED ORDER — CYCLOBENZAPRINE HCL 10 MG PO TABS
10.0000 mg | ORAL_TABLET | Freq: Three times a day (TID) | ORAL | Status: DC | PRN
  Administered 2021-11-13: 10 mg via ORAL
  Filled 2021-11-13 (×2): qty 1

## 2021-11-13 MED ORDER — ONDANSETRON HCL 4 MG/2ML IJ SOLN
INTRAMUSCULAR | Status: AC
  Filled 2021-11-13: qty 2

## 2021-11-13 MED ORDER — METFORMIN HCL 500 MG PO TABS
1000.0000 mg | ORAL_TABLET | Freq: Every day | ORAL | Status: DC
Start: 2021-11-14 — End: 2021-11-14
  Administered 2021-11-14: 1000 mg via ORAL
  Filled 2021-11-13: qty 2

## 2021-11-13 MED ORDER — LIDOCAINE-EPINEPHRINE 1 %-1:100000 IJ SOLN
INTRAMUSCULAR | Status: DC | PRN
  Administered 2021-11-13: 10 mL

## 2021-11-13 MED ORDER — ACETAMINOPHEN 10 MG/ML IV SOLN
INTRAVENOUS | Status: DC | PRN
  Administered 2021-11-13: 1000 mg via INTRAVENOUS

## 2021-11-13 MED ORDER — CEFAZOLIN SODIUM-DEXTROSE 2-4 GM/100ML-% IV SOLN
2.0000 g | INTRAVENOUS | Status: AC
  Administered 2021-11-13: 2 g via INTRAVENOUS

## 2021-11-13 MED ORDER — SODIUM CHLORIDE 0.9 % IV SOLN: 250.0000 mL | INTRAVENOUS | Status: DC

## 2021-11-13 MED ORDER — PHENOL 1.4 % MT LIQD: 1.0000 | OROMUCOSAL | Status: DC | PRN

## 2021-11-13 MED ORDER — HYDROCHLOROTHIAZIDE 12.5 MG PO TABS: 12.5000 mg | ORAL_TABLET | Freq: Every morning | ORAL | Status: DC

## 2021-11-13 MED ORDER — CHLORHEXIDINE GLUCONATE CLOTH 2 % EX PADS
6.0000 | MEDICATED_PAD | Freq: Every day | CUTANEOUS | Status: DC
  Administered 2021-11-14: 6 via TOPICAL

## 2021-11-13 MED ORDER — ONDANSETRON HCL 4 MG/2ML IJ SOLN
INTRAMUSCULAR | Status: DC | PRN
Start: 2021-11-13 — End: 2021-11-13
  Administered 2021-11-13: 4 mg via INTRAVENOUS

## 2021-11-13 MED ORDER — ORAL CARE MOUTH RINSE: 15.0000 mL | Freq: Once | OROMUCOSAL | Status: AC

## 2021-11-13 MED ORDER — CHLORHEXIDINE GLUCONATE 0.12 % MT SOLN
OROMUCOSAL | Status: AC
  Administered 2021-11-13: 15 mL via OROMUCOSAL
  Filled 2021-11-13: qty 15

## 2021-11-13 SURGICAL SUPPLY — 64 items
BAG COUNTER SPONGE SURGICOUNT (BAG) ×3 IMPLANT
BAND RUBBER #18 3X1/16 STRL (MISCELLANEOUS) ×4 IMPLANT
BASKET BONE COLLECTION (BASKET) ×3 IMPLANT
BLADE CLIPPER SURG (BLADE) IMPLANT
BLADE SURG 11 STRL SS (BLADE) ×2 IMPLANT
BUR MATCHSTICK NEURO 3.0 LAGG (BURR) IMPLANT
BUR ROUND PRECISION 4.0 (BURR) ×2 IMPLANT
CAGE EXP CATALYFT 9 (Plate) ×1 IMPLANT
CNTNR URN SCR LID CUP LEK RST (MISCELLANEOUS) ×1 IMPLANT
CONT SPEC 4OZ STRL OR WHT (MISCELLANEOUS) ×2
COVER BACK TABLE 60X90IN (DRAPES) ×2 IMPLANT
DECANTER SPIKE VIAL GLASS SM (MISCELLANEOUS) ×1 IMPLANT
DERMABOND ADVANCED (GAUZE/BANDAGES/DRESSINGS) ×1
DERMABOND ADVANCED .7 DNX12 (GAUZE/BANDAGES/DRESSINGS) ×1 IMPLANT
DRAPE C-ARM 42X72 X-RAY (DRAPES) ×2 IMPLANT
DRAPE C-ARMOR (DRAPES) ×2 IMPLANT
DRAPE LAPAROTOMY 100X72X124 (DRAPES) ×2 IMPLANT
DRAPE MICROSCOPE LEICA (MISCELLANEOUS) ×2 IMPLANT
DRAPE SURG 17X23 STRL (DRAPES) ×4 IMPLANT
ELECT BLADE 6.5 EXT (BLADE) ×2 IMPLANT
ELECT REM PT RETURN 9FT ADLT (ELECTROSURGICAL) ×2
ELECTRODE REM PT RTRN 9FT ADLT (ELECTROSURGICAL) ×1 IMPLANT
EXTENDER TAB GUIDE SV 5.5/6.0 (INSTRUMENTS) ×8 IMPLANT
GAUZE 4X4 16PLY ~~LOC~~+RFID DBL (SPONGE) ×1 IMPLANT
GAUZE SPONGE 4X4 12PLY STRL (GAUZE/BANDAGES/DRESSINGS) ×2 IMPLANT
GLOVE SURG LTX SZ7.5 (GLOVE) ×3 IMPLANT
GLOVE SURG POLYISO LF SZ7.5 (GLOVE) ×3 IMPLANT
GLOVE SURG UNDER POLY LF SZ6.5 (GLOVE) ×3 IMPLANT
GLOVE SURG UNDER POLY LF SZ7 (GLOVE) ×3 IMPLANT
GLOVE SURG UNDER POLY LF SZ7.5 (GLOVE) ×5 IMPLANT
GOWN STRL REUS W/ TWL LRG LVL3 (GOWN DISPOSABLE) ×1 IMPLANT
GOWN STRL REUS W/ TWL XL LVL3 (GOWN DISPOSABLE) IMPLANT
GOWN STRL REUS W/TWL 2XL LVL3 (GOWN DISPOSABLE) IMPLANT
GOWN STRL REUS W/TWL LRG LVL3 (GOWN DISPOSABLE) ×2
GOWN STRL REUS W/TWL XL LVL3 (GOWN DISPOSABLE) ×2
GUIDEWIRE BLUNT NT 450 (WIRE) ×4 IMPLANT
HEMOSTAT POWDER KIT SURGIFOAM (HEMOSTASIS) ×2 IMPLANT
KIT BASIN OR (CUSTOM PROCEDURE TRAY) ×2 IMPLANT
KIT POSITION SURG JACKSON T1 (MISCELLANEOUS) ×2 IMPLANT
KIT TURNOVER KIT B (KITS) ×1 IMPLANT
NDL BEVEL TWO-PAK W/1PK (NEEDLE) IMPLANT
NDL HYPO 18GX1.5 BLUNT FILL (NEEDLE) IMPLANT
NDL SPNL 18GX3.5 QUINCKE PK (NEEDLE) IMPLANT
NEEDLE BEVEL TWO-PAK W/1PK (NEEDLE) ×2 IMPLANT
NEEDLE HYPO 18GX1.5 BLUNT FILL (NEEDLE) IMPLANT
NEEDLE HYPO 22GX1.5 SAFETY (NEEDLE) ×2 IMPLANT
NEEDLE SPNL 18GX3.5 QUINCKE PK (NEEDLE) IMPLANT
NS IRRIG 1000ML POUR BTL (IV SOLUTION) ×2 IMPLANT
PACK LAMINECTOMY NEURO (CUSTOM PROCEDURE TRAY) ×2 IMPLANT
PAD ARMBOARD 7.5X6 YLW CONV (MISCELLANEOUS) ×4 IMPLANT
ROD PERC CCM 5.5X35 (Rod) ×2 IMPLANT
SCREW MAS VG 5.5X7.5X40 (Screw) ×2 IMPLANT
SCREW MAS VG 7.5X35 (Screw) ×2 IMPLANT
SCREW SET 5.5/6.0MM SOLERA (Screw) ×4 IMPLANT
SPONGE T-LAP 4X18 ~~LOC~~+RFID (SPONGE) ×1 IMPLANT
SUT MNCRL AB 3-0 PS2 18 (SUTURE) ×3 IMPLANT
SUT VIC AB 0 CT1 18XCR BRD8 (SUTURE) IMPLANT
SUT VIC AB 0 CT1 8-18 (SUTURE) ×2
SUT VIC AB 2-0 CP2 18 (SUTURE) ×4 IMPLANT
SYR 3ML LL SCALE MARK (SYRINGE) IMPLANT
TOWEL GREEN STERILE (TOWEL DISPOSABLE) ×2 IMPLANT
TOWEL GREEN STERILE FF (TOWEL DISPOSABLE) ×2 IMPLANT
TRAY FOLEY MTR SLVR 16FR STAT (SET/KITS/TRAYS/PACK) ×1 IMPLANT
WATER STERILE IRR 1000ML POUR (IV SOLUTION) ×2 IMPLANT

## 2021-11-13 NOTE — Anesthesia Procedure Notes (Signed)
Procedure Name: Intubation ?Date/Time: 11/13/2021 7:40 AM ?Performed by: Lowella Dell, CRNA ?Pre-anesthesia Checklist: Patient identified, Emergency Drugs available, Suction available and Patient being monitored ?Patient Re-evaluated:Patient Re-evaluated prior to induction ?Oxygen Delivery Method: Circle System Utilized ?Preoxygenation: Pre-oxygenation with 100% oxygen ?Induction Type: IV induction ?Ventilation: Mask ventilation without difficulty and Oral airway inserted - appropriate to patient size ?Laryngoscope Size: Mac and 3 ?Grade View: Grade I ?Tube type: Oral ?Tube size: 7.0 mm ?Number of attempts: 1 ?Airway Equipment and Method: Stylet ?Placement Confirmation: ETT inserted through vocal cords under direct vision, positive ETCO2 and breath sounds checked- equal and bilateral ?Secured at: 22 cm ?Tube secured with: Tape ?Dental Injury: Teeth and Oropharynx as per pre-operative assessment  ? ? ? ? ?

## 2021-11-13 NOTE — Transfer of Care (Signed)
Immediate Anesthesia Transfer of Care Note ? ?Patient: Carmen Davies ? ?Procedure(s) Performed: Left Lumbar five-Sacral one Minimally Invasive Transforminal Lumbar Interbody Fusion (Left: Spine Lumbar) ? ?Patient Location: PACU ? ?Anesthesia Type:General ? ?Level of Consciousness: awake and patient cooperative ? ?Airway & Oxygen Therapy: Patient Spontanous Breathing and Patient connected to face mask oxygen ? ?Post-op Assessment: Report given to RN, Post -op Vital signs reviewed and stable and Patient moving all extremities X 4 ? ?Post vital signs: Reviewed and stable ? ?Last Vitals:  ?Vitals Value Taken Time  ?BP 128/62 11/13/21 1049  ?Temp    ?Pulse 74 11/13/21 1048  ?Resp 19 11/13/21 1049  ?SpO2 99 % 11/13/21 1048  ?Vitals shown include unvalidated device data. ? ?Last Pain:  ?Vitals:  ? 11/13/21 0620  ?TempSrc: Oral  ?PainSc:   ?   ? ?Patients Stated Pain Goal: 2 (11/12/21 1100) ? ?Complications: No notable events documented. ?

## 2021-11-13 NOTE — Anesthesia Postprocedure Evaluation (Signed)
Anesthesia Post Note ? ?Patient: NAKIYA RALLIS ? ?Procedure(s) Performed: Left Lumbar five-Sacral one Minimally Invasive Transforminal Lumbar Interbody Fusion (Left: Spine Lumbar) ? ?  ? ?Patient location during evaluation: PACU ?Anesthesia Type: General ?Level of consciousness: awake and alert ?Pain management: pain level controlled ?Vital Signs Assessment: post-procedure vital signs reviewed and stable ?Respiratory status: spontaneous breathing, nonlabored ventilation, respiratory function stable and patient connected to nasal cannula oxygen ?Cardiovascular status: blood pressure returned to baseline and stable ?Postop Assessment: no apparent nausea or vomiting ?Anesthetic complications: no ? ? ?No notable events documented. ? ?Last Vitals:  ?Vitals:  ? 11/13/21 1241 11/13/21 1751  ?BP: (!) 110/53 135/63  ?Pulse: 63 73  ?Resp: 18 18  ?Temp:  36.8 ?C  ?SpO2: 95% 95%  ?  ?Last Pain:  ?Vitals:  ? 11/13/21 1751  ?TempSrc: Oral  ?PainSc:   ? ? ?  ?  ?  ?  ?  ?  ? ?Truro S ? ? ? ? ?

## 2021-11-13 NOTE — Op Note (Signed)
PATIENT: Carmen Davies ? ?DAY OF SURGERY: 11/13/21 ?  ?PRE-OPERATIVE DIAGNOSIS:  Lumbar radiculopathy ?  ?POST-OPERATIVE DIAGNOSIS:  Same ?  ?PROCEDURE:  Left L5-S1 minimally invasive transforaminal lumbar interbody fusion  ?  ?SURGEON:  Surgeon(s) and Role: ?   Judith Part, MD - Primary ?   Duffy Rhody, MD - Assisting ?  ?ANESTHESIA: ETGA ?  ?BRIEF HISTORY: This is a 71 year old woman who presented with left lower extremity radiculopathy c/w an L5 radiculopathy. 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 Given the extent of the facetectomy needed, I recommended decompression and a left L5-S1 TLIF. This was discussed with the patient as well as risks, benefits, and alternatives and wished to proceed with surgery. ?  ?OPERATIVE DETAIL:  The patient was taken to the operating room and placed on the OR table in the prone position. A formal time out was performed with two patient identifiers and confirmed the operative site. Anesthesia was induced by the anesthesia team. The operative site was marked, hair was clipped with surgical clippers, the area was then prepped and draped in a sterile fashion.  ? ?Fluoroscopy was used to localize the surgical level. The pedicles were marked and used to create skin incisions bilaterally. With fluoro guidance, Jamshidi needles were used to guide K-wires into the bilateral L5 and S1 pedicles. The K wires were then secured with hemostats and attention turned to the TLIF. ? ?A [employer] tube was then docked to the left L5-S1 facet through the same incision using fluoroscopy. A left L5-S1 facetectomy was performed and the left L5 nerve root was decompressed along its entire course. The tube was wanded medially and the decompression was continued medially until reaching the contralateral foramen. The tube was wanded back to the disc space. The disc space was identified, incised, and a discectomy was performed in the standard fashion. The endplates were  prepped, bone graft was packed into the disc space, and an expandable cage ([employer]) was packed with autograft and placed into the disc space with fluoroscopic confirmation. The tube was removed and hemostasis was obtained during its removal.  ? ?Using the previously placed K wires, a tap and then screw with tower were placed bilaterally at L5 and S1. A rod was sized and introduced on both sides, confirmed with fluoroscopy, then final tightened. Hemostasis was again confirmed for both incisions, they were copiously irrigated, and then closed in layers.  ?  ?EBL:  113m ?  ?DRAINS: none ?  ?SPECIMENS: none ?  ?TJudith Part MD ?11/13/21 ?7:09 AM ? ?

## 2021-11-13 NOTE — H&P (Signed)
Surgical H&P Update ? ?HPI: 71 y.o. with a history of severe left lower extremity pain due to lumbar radiculopathy. 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 No changes in health since they were last seen. Still having the above and wishes to proceed with surgery. ? ?PMHx:  ?Past Medical History:  ?Diagnosis Date  ? COVID-19 virus detected 10/20/2021  ? 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 Diverticulitis   ? Hyperlipidemia   ? Hypertension   ? Varicose veins   ? ?FamHx: History reviewed. No pertinent family history. ?SocHx:  reports that she has never smoked. She has never used smokeless tobacco. She reports that she does not drink alcohol and does not use drugs. ? ?Physical Exam: ?Strength 5/5 x4 and SILTx4  ? ?Assesment/Plan: ?71 y.o. woman with severe left L5 radiculopathy, here for left L5-S1 decompression and TLIF. Risks, benefits, and alternatives discussed and the patient would like to continue with surgery. ? ?-OR today ?-3C post-op ? ?Judith Part, MD ?11/13/21 ?6:02 AM ? ?

## 2021-11-14 DIAGNOSIS — M5416 Radiculopathy, lumbar region: Secondary | ICD-10-CM | POA: Diagnosis not present

## 2021-11-14 LAB — GLUCOSE, CAPILLARY: Glucose-Capillary: 163 mg/dL — ABNORMAL HIGH (ref 70–99)

## 2021-11-14 MED ORDER — OXYCODONE HCL 5 MG PO TABS: 5.0000 mg | ORAL_TABLET | ORAL | 0 refills | Status: DC | PRN

## 2021-11-14 MED ORDER — CYCLOBENZAPRINE HCL 10 MG PO TABS: 10.0000 mg | ORAL_TABLET | Freq: Three times a day (TID) | ORAL | 2 refills | Status: DC | PRN

## 2021-11-14 NOTE — Progress Notes (Signed)
Neurosurgery Service ?Progress Note ? ?Subjective: No acute events overnight, preop radicular pain resolved, no new complaints except expected incisional pain, which is much less than average, ambulating well, she's very happy  ? ?Objective: ?Vitals:  ? 11/13/21 1751 11/13/21 1921 11/13/21 2259 11/14/21 0334  ?BP: 135/63 (!) 110/48 (!) 115/53 105/79  ?Pulse: 73 76 72 66  ?Resp: '18 18 18 18  '$ ?Temp: 98.2 ?F (36.8 ?C) 98.4 ?F (36.9 ?C) 98.4 ?F (36.9 ?C) 98.4 ?F (36.9 ?C)  ?TempSrc: Oral Oral Oral Oral  ?SpO2: 95% 94% 92% 94%  ?Weight:      ?Height:      ? ? ?Physical Exam: ?Strength 5/5 x4 and SILTx4  ? ?Assessment & Plan: ?71 y.o. woman w/ LLE radic 2/2 foraminal stenosis s/p facetectomy and L5-S1 TLIF, recovering well. ? ?-discharge home today p PT/OT ? ?Joyice Faster Greer Wainright  ?11/14/21 ?6:39 AM ? ?

## 2021-11-14 NOTE — Evaluation (Signed)
Occupational Therapy Evaluation ?Patient Details ?Name: Carmen Davies ?MRN: [id number] ?DOB: 1950/12/17 ?Today's Date: 11/14/2021 ? ? ?History of Present Illness Patient is a 71 yo female admitted for L5-S1 fusion on 11/13/21. PMH includes: DM2, HTN, and hyperlipidemia  ? ?Clinical Impression ?  ?Prior to this admission, patient living with her husband and completing ADLs/IADLs independently, still driving, and volunteering. Currently, patient is independent with bed mobility, ADLs, and transfers, and appropriately adhering to back precautions. OT is not recommending further services or need for equipment after discharge due to current level of function. OT answering all questions posed by patient and patient able to demonstrate and teach back education provided. OT signing off, please re-consult if further needs arise.  ?   ? ?Recommendations for follow up therapy are one component of a multi-disciplinary discharge planning process, led by the attending physician.  Recommendations may be updated based on patient status, additional functional criteria and insurance authorization.  ? ?Follow Up Recommendations ? No OT follow up  ?  ?Assistance Recommended at Discharge PRN  ?Patient can return home with the following Assistance with cooking/housework ? ?  ?Functional Status Assessment ? Patient has had a recent decline in their functional status and demonstrates the ability to make significant improvements in function in a reasonable and predictable amount of time.  ?Equipment Recommendations ? None recommended by OT  ?  ?Recommendations for Other Services   ? ? ?  ?Precautions / Restrictions Precautions ?Precautions: Back ?Precaution Booklet Issued: No ?Precaution Comments: Able to recite back to therapist 3/3 once educated ?Restrictions ?Weight Bearing Restrictions: No ?Other Position/Activity Restrictions: WBAT, back precautions, no brace needed  ? ?  ? ?Mobility Bed Mobility ?Overal bed mobility: Independent ?  ?  ?   ?  ?  ?  ?General bed mobility comments: good demonstration of log rolling ad back precautions ?  ? ?Transfers ?Overall transfer level: Independent ?Equipment used: Rolling walker (2 wheels) ?  ?  ?  ?  ?  ?  ?  ?General transfer comment: patient not needing RW for ambulation, using for comfort for prolonged distances ?  ? ?  ?Balance Overall balance assessment: Modified Independent ?  ?  ?  ?  ?  ?  ?  ?  ?  ?  ?  ?  ?  ?  ?  ?  ?  ?  ?   ? ?ADL either performed or assessed with clinical judgement  ? ?ADL Overall ADL's : At baseline ?  ?  ?  ?  ?  ?  ?  ?  ?  ?  ?  ?  ?  ?  ?  ?  ?  ?  ?  ?General ADL Comments: Patient adhering to back precautions prior to surgery due to back pain. Able to demonstrate figure 4 bilaterally and toilet transfers  ? ? ? ?Vision Baseline Vision/History: 1 Wears glasses (Readers) ?Ability to See in Adequate Light: 0 Adequate ?Patient Visual Report: No change from baseline ?   ?   ?Perception   ?  ?Praxis   ?  ? ?Pertinent Vitals/Pain Pain Assessment ?Pain Assessment: No/denies pain  ? ? ? ?Hand Dominance   ?  ?Extremity/Trunk Assessment Upper Extremity Assessment ?Upper Extremity Assessment: Overall WFL for tasks assessed ?  ?Lower Extremity Assessment ?Lower Extremity Assessment: Defer to PT evaluation;Overall [facility] for tasks assessed ?  ?Cervical / Trunk Assessment ?Cervical / Trunk Assessment: Back Surgery ?  ?Communication Communication ?Communication: No difficulties ?  ?  Cognition Arousal/Alertness: Awake/alert ?Behavior During Therapy: [facility] for tasks assessed/performed ?Overall Cognitive Status: Within Functional Limits for tasks assessed ?  ?  ?  ?  ?  ?  ?  ?  ?  ?  ?  ?  ?  ?  ?  ?  ?  ?  ?  ?General Comments    ? ?  ?Exercises   ?  ?Shoulder Instructions    ? ? ?Home Living Family/patient expects to be discharged to:: Private residence ?Living Arrangements: Spouse/significant other ?Available Help at Discharge: Family ?Type of Home: House ?Home Access: Stairs to enter ?Entrance  Stairs-Number of Steps: 2 ?Entrance Stairs-Rails: None ?Home Layout: Two level;Full bath on main level;Able to live on main level with bedroom/bathroom ?  ?  ?Bathroom Shower/Tub: Walk-in shower ?  ?Bathroom Toilet: Standard ?Bathroom Accessibility: No ?  ?Home Equipment: Conservation officer, nature (2 wheels);Cane - single point ?  ?Additional Comments: Was independent and volunteering at [facility] ?  ? ?  ?Prior Functioning/Environment Prior Level of Function : Independent/Modified Independent;Driving ?  ?  ?  ?  ?  ?  ?Mobility Comments: walking with back brace for comfort ?ADLs Comments: independent ?  ? ?  ?  ?OT Problem List: Decreased activity tolerance ?  ?   ?OT Treatment/Interventions:    ?  ?OT Goals(Current goals can be found in the care plan section) Acute Rehab OT Goals ?Patient Stated Goal: to get back to volunteering ?OT Goal Formulation: With patient ?Time For Goal Achievement: 11/28/21 ?Potential to Achieve Goals: Good  ?OT Frequency:   ?  ? ?Co-evaluation   ?  ?  ?  ?  ? ?  ?AM-PAC OT "6 Clicks" Daily Activity     ?Outcome Measure Help from another person eating meals?: None ?Help from another person taking care of personal grooming?: None ?Help from another person toileting, which includes using toliet, bedpan, or urinal?: None ?Help from another person bathing (including washing, rinsing, drying)?: None ?Help from another person to put on and taking off regular upper body clothing?: None ?Help from another person to put on and taking off regular lower body clothing?: None ?6 Click Score: 24 ?  ?End of Session Equipment Utilized During Treatment: Rolling walker (2 wheels) ?Nurse Communication: Mobility status ? ?Activity Tolerance: Patient tolerated treatment well ?Patient left: in bed;with call bell/phone within reach ? ?OT Visit Diagnosis: Unsteadiness on feet (R26.81);Other abnormalities of gait and mobility (R26.89)  ?              ?Time: 5885-0277 ?OT Time Calculation (min): 16 min ?Charges:  OT  General Charges ?$OT Visit: 1 Visit ?OT Evaluation ?$OT Eval Moderate Complexity: 1 Mod ? ?Corinne Ports E. Rionna Feltes, OTR/L ?Acute Rehabilitation Services ?[phone] ?[phone]  ? ?Corinne Ports Janat Tabbert ?11/14/2021, 9:27 AM ?

## 2021-11-14 NOTE — Evaluation (Signed)
Physical Therapy Evaluation and Discharge ? ?Patient Details ?Name: Carmen Davies ?MRN: [id number] ?DOB: Nov 04, 1950 ?Today's Date: 11/14/2021 ? ?History of Present Illness ? Patient is a 71 yo female admitted for L5-S1 fusion on 11/13/21. PMH includes: DM2, HTN.  ?Clinical Impression ? Patient evaluated by Physical Therapy with no further acute PT needs identified. All education has been completed and the patient has no further questions. Pt was able to demonstrate transfers and ambulation with gross modified independence and RW for support. Pt was educated on precautions, positioning recommendations, appropriate activity progression, and car transfer. See below for any follow-up Physical Therapy or equipment needs. PT is signing off. Thank you for this referral.    ?   ? ?Recommendations for follow up therapy are one component of a multi-disciplinary discharge planning process, led by the attending physician.  Recommendations may be updated based on patient status, additional functional criteria and insurance authorization. ? ?Follow Up Recommendations No PT follow up ? ?  ?Assistance Recommended at Discharge PRN  ?Patient can return home with the following ? Assist for transportation;Help with stairs or ramp for entrance ? ?  ?Equipment Recommendations None recommended by PT  ?Recommendations for Other Services ?    ?  ?Functional Status Assessment Patient has had a recent decline in their functional status and demonstrates the ability to make significant improvements in function in a reasonable and predictable amount of time.  ? ?  ?Precautions / Restrictions Precautions ?Precautions: Back ?Precaution Booklet Issued: Yes (comment) ?Precaution Comments: Reviewed handout and pt was cued for precautions during functional mobility. ?Restrictions ?Weight Bearing Restrictions: No ?Other Position/Activity Restrictions: WBAT, back precautions, no brace needed  ? ?  ? ?Mobility ? Bed Mobility ?  ?  ?  ?  ?  ?  ?  ?General  bed mobility comments: Pt was received sitting EOB. Reviewed log roll technique. ?  ? ?Transfers ?Overall transfer level: Modified independent ?Equipment used: Rolling walker (2 wheels) ?  ?  ?  ?  ?  ?  ?  ?General transfer comment: Pt demonstrated proper hand placement on seated surface for safety. No assist required. ?  ? ?Ambulation/Gait ?Ambulation/Gait assistance: Modified independent (Device/Increase time) ?Gait Distance (Feet): 500 Feet ?Assistive device: Rolling walker (2 wheels) ?Gait Pattern/deviations: Step-through pattern, Decreased stride length, Trunk flexed ?Gait velocity: Decreased ?Gait velocity interpretation: 1.31 - 2.62 ft/sec, indicative of limited community ambulator ?  ?General Gait Details: VC's for improved posture. Moving slow but generally steady without overt LOB. ? ?Stairs ?Stairs: Yes ?Stairs assistance: Supervision ?Stair Management: One rail Right, Step to pattern, Forwards ?Number of Stairs: 3 ?General stair comments: VC's for sequencing and general safety. No assist required. ? ?Wheelchair Mobility ?  ? ?Modified Rankin (Stroke Patients Only) ?  ? ?  ? ?Balance Overall balance assessment: Modified Independent ?  ?  ?  ?  ?  ?  ?  ?  ?  ?  ?  ?  ?  ?  ?  ?  ?  ?  ?   ? ? ? ?Pertinent Vitals/Pain Pain Assessment ?Pain Assessment: Faces ?Faces Pain Scale: Hurts a little bit ?Pain Location: incision site ?Pain Descriptors / Indicators: Operative site guarding, Sore ?Pain Intervention(s): Limited activity within patient's tolerance, Monitored during session, Repositioned  ? ? ?Home Living Family/patient expects to be discharged to:: Private residence ?Living Arrangements: Spouse/significant other ?Available Help at Discharge: Family ?Type of Home: House ?Home Access: Stairs to enter ?Entrance Stairs-Rails: None ?Entrance Stairs-Number of  Steps: 2 ?Alternate Level Stairs-Number of Steps: flight ?Home Layout: Two level;Full bath on main level;Able to live on main level with  bedroom/bathroom ?Home Equipment: Conservation officer, nature (2 wheels);Cane - single point ?Additional Comments: Was independent and volunteering at [facility]  ?  ?Prior Function Prior Level of Function : Independent/Modified Independent;Driving ?  ?  ?  ?  ?  ?  ?Mobility Comments: walking with back brace for comfort ?ADLs Comments: independent ?  ? ? ?Hand Dominance  ?   ? ?  ?Extremity/Trunk Assessment  ? Upper Extremity Assessment ?Upper Extremity Assessment: Overall WFL for tasks assessed ?  ? ?Lower Extremity Assessment ?Lower Extremity Assessment: Generalized weakness (mild; consistent with pre-op diagnosis) ?  ? ?Cervical / Trunk Assessment ?Cervical / Trunk Assessment: Back Surgery  ?Communication  ? Communication: No difficulties  ?Cognition Arousal/Alertness: Awake/alert ?Behavior During Therapy: [facility] for tasks assessed/performed ?Overall Cognitive Status: Within Functional Limits for tasks assessed ?  ?  ?  ?  ?  ?  ?  ?  ?  ?  ?  ?  ?  ?  ?  ?  ?  ?  ?  ? ?  ?General Comments   ? ?  ?Exercises    ? ?Assessment/Plan  ?  ?PT Assessment Patient does not need any further PT services  ?PT Problem List   ? ?   ?  ?PT Treatment Interventions     ? ?PT Goals (Current goals can be found in the Care Plan section)  ?Acute Rehab PT Goals ?Patient Stated Goal: home today ?PT Goal Formulation: All assessment and education complete, DC therapy ? ?  ?Frequency   ?  ? ? ?Co-evaluation   ?  ?  ?  ?  ? ? ?  ?AM-PAC PT "6 Clicks" Mobility  ?Outcome Measure Help needed turning from your back to your side while in a flat bed without using bedrails?: None ?Help needed moving from lying on your back to sitting on the side of a flat bed without using bedrails?: None ?Help needed moving to and from a bed to a chair (including a wheelchair)?: None ?Help needed standing up from a chair using your arms (e.g., wheelchair or bedside chair)?: None ?Help needed to walk in hospital room?: None ?Help needed climbing 3-5 steps with a railing? :  None ?6 Click Score: 24 ? ?  ?End of Session Equipment Utilized During Treatment: Gait belt ?Activity Tolerance: Patient tolerated treatment well ?Patient left: in bed;with call bell/phone within reach ?Nurse Communication: Mobility status ?PT Visit Diagnosis: Unsteadiness on feet (R26.81);Pain ?Pain - part of body:  (back) ?  ? ?Time: 9678-9381 ?PT Time Calculation (min) (ACUTE ONLY): 13 min ? ? ?Charges:   PT Evaluation ?$PT Eval Low Complexity: 1 Low ?  ?  ?   ? ? ?Rolinda Roan, PT, DPT ?Acute Rehabilitation Services ?Pager: [phone] ?Office: [phone]  ? ?Thelma Comp ?11/14/2021, 10:49 AM ? ?

## 2021-11-14 NOTE — Plan of Care (Signed)
?  Problem: Education: Goal: Ability to verbalize activity precautions or restrictions will improve Outcome: Completed/Met Goal: Knowledge of the prescribed therapeutic regimen will improve Outcome: Completed/Met Goal: Understanding of discharge needs will improve Outcome: Completed/Met   Problem: Activity: Goal: Ability to avoid complications of mobility impairment will improve Outcome: Completed/Met Goal: Ability to tolerate increased activity will improve Outcome: Completed/Met Goal: Will remain free from falls Outcome: Completed/Met   Problem: Bowel/Gastric: Goal: Gastrointestinal status for postoperative course will improve Outcome: Completed/Met   Problem: Clinical Measurements: Goal: Ability to maintain clinical measurements within normal limits will improve Outcome: Completed/Met Goal: Postoperative complications will be avoided or minimized Outcome: Completed/Met Goal: Diagnostic test results will improve Outcome: Completed/Met   Problem: Pain Management: Goal: Pain level will decrease Outcome: Completed/Met   Problem: Skin Integrity: Goal: Will show signs of wound healing Outcome: Completed/Met   Problem: Health Behavior/Discharge Planning: Goal: Identification of resources available to assist in meeting health care needs will improve Outcome: Completed/Met   Problem: Bladder/Genitourinary: Goal: Urinary functional status for postoperative course will improve Outcome: Completed/Met   

## 2021-11-14 NOTE — Discharge Summary (Signed)
Discharge Summary ? ?Date of Admission: 11/13/2021 ? ?Date of Discharge: 11/14/21 ? ?Attending Physician: Emelda Brothers, MD ? ?Hospital Course: Patient was admitted following an uncomplicated B3-X6 MIS TLIF. They were recovered in PACU and transferred to [facility]. Their preop symptoms were completely resolved, their hospital course was uncomplicated and the patient was discharged home on 11/14/21. They will follow up in clinic with me in clinic in 2 weeks. ? ?Neurologic exam at discharge:  ?Strength 5/5 x4 and SILTx4  ? ?Discharge diagnosis: Lumbar radiculopathy ? ?Judith Part, MD ?11/14/21 ?6:40 AM ? ?

## 2021-11-14 NOTE — Progress Notes (Signed)
Patient awaiting transport via wheelchair by NT for discharge home; in no acute distress nor complaints of pain nor discomfort; incisions on her lower back with skin glue and are clean, dry and intact; room was checked for her belongings; discharge instructions concerning her medications, incision care, follow up appointment and when to call the doctor as needed were all discussed with patient by RN and she expressed understanding on the instructions given. ?

## 2021-11-18 DIAGNOSIS — E1169 Type 2 diabetes mellitus with other specified complication: Secondary | ICD-10-CM | POA: Diagnosis not present

## 2021-11-18 DIAGNOSIS — I10 Essential (primary) hypertension: Secondary | ICD-10-CM | POA: Diagnosis not present

## 2021-11-18 DIAGNOSIS — E7800 Pure hypercholesterolemia, unspecified: Secondary | ICD-10-CM | POA: Diagnosis not present

## 2021-11-19 ENCOUNTER — Encounter (HOSPITAL_COMMUNITY): Payer: Self-pay | Admitting: Neurological Surgery

## 2021-11-22 ENCOUNTER — Other Ambulatory Visit: Payer: Self-pay

## 2021-11-22 ENCOUNTER — Emergency Department (HOSPITAL_COMMUNITY): Payer: PPO

## 2021-11-22 ENCOUNTER — Emergency Department (HOSPITAL_COMMUNITY)
Admission: EM | Admit: 2021-11-22 | Discharge: 2021-11-22 | Disposition: A | Discharge status: Home / Self Care | Payer: PPO | Attending: Emergency Medicine | Admitting: Emergency Medicine

## 2021-11-22 DIAGNOSIS — F05 Delirium due to known physiological condition: Secondary | ICD-10-CM | POA: Diagnosis not present

## 2021-11-22 DIAGNOSIS — E119 Type 2 diabetes mellitus without complications: Secondary | ICD-10-CM | POA: Insufficient documentation

## 2021-11-22 DIAGNOSIS — Z79899 Other long term (current) drug therapy: Secondary | ICD-10-CM | POA: Insufficient documentation

## 2021-11-22 DIAGNOSIS — N3001 Acute cystitis with hematuria: Secondary | ICD-10-CM | POA: Insufficient documentation

## 2021-11-22 DIAGNOSIS — Z7982 Long term (current) use of aspirin: Secondary | ICD-10-CM | POA: Diagnosis not present

## 2021-11-22 DIAGNOSIS — I10 Essential (primary) hypertension: Secondary | ICD-10-CM | POA: Insufficient documentation

## 2021-11-22 DIAGNOSIS — R442 Other hallucinations: Secondary | ICD-10-CM | POA: Diagnosis not present

## 2021-11-22 DIAGNOSIS — F29 Unspecified psychosis not due to a substance or known physiological condition: Secondary | ICD-10-CM | POA: Diagnosis not present

## 2021-11-22 DIAGNOSIS — R4182 Altered mental status, unspecified: Secondary | ICD-10-CM | POA: Diagnosis not present

## 2021-11-22 DIAGNOSIS — R441 Visual hallucinations: Secondary | ICD-10-CM | POA: Diagnosis not present

## 2021-11-22 DIAGNOSIS — R000 Tachycardia, unspecified: Secondary | ICD-10-CM | POA: Diagnosis not present

## 2021-11-22 DIAGNOSIS — R410 Disorientation, unspecified: Secondary | ICD-10-CM | POA: Insufficient documentation

## 2021-11-22 LAB — URINALYSIS, ROUTINE W REFLEX MICROSCOPIC
Bilirubin Urine: NEGATIVE
Glucose, UA: NEGATIVE mg/dL
Ketones, ur: NEGATIVE mg/dL
Nitrite: POSITIVE — AB
Specific Gravity, Urine: 1.025 (ref 1.005–1.030)
pH: 5.5 (ref 5.0–8.0)

## 2021-11-22 LAB — COMPREHENSIVE METABOLIC PANEL
ALT: 23 U/L (ref 0–44)
AST: 39 U/L (ref 15–41)
Albumin: 4 g/dL (ref 3.5–5.0)
Alkaline Phosphatase: 78 U/L (ref 38–126)
Anion gap: 14 (ref 5–15)
BUN: 28 mg/dL — ABNORMAL HIGH (ref 8–23)
CO2: 23 mmol/L (ref 22–32)
Calcium: 9.8 mg/dL (ref 8.9–10.3)
Chloride: 100 mmol/L (ref 98–111)
Creatinine, Ser: 0.78 mg/dL (ref 0.44–1.00)
GFR, Estimated: >60 mL/min (ref >60)
Glucose, Bld: 143 mg/dL — ABNORMAL HIGH (ref 70–99)
Potassium: 3.9 mmol/L (ref 3.5–5.1)
Sodium: 137 mmol/L (ref 135–145)
Total Bilirubin: 0.7 mg/dL (ref 0.3–1.2)
Total Protein: 8.6 g/dL — ABNORMAL HIGH (ref 6.5–8.1)

## 2021-11-22 LAB — CBC WITH DIFFERENTIAL/PLATELET
Abs Immature Granulocytes: 0.03 10*3/uL (ref 0.00–0.07)
Basophils Absolute: 0 10*3/uL (ref 0.0–0.1)
Basophils Relative: 1 %
Eosinophils Absolute: 0.1 10*3/uL (ref 0.0–0.5)
Eosinophils Relative: 1 %
HCT: 35.9 % — ABNORMAL LOW (ref 36.0–46.0)
Hemoglobin: 12 g/dL (ref 12.0–15.0)
Immature Granulocytes: 1 %
Lymphocytes Relative: 22 %
Lymphs Abs: 1.3 10*3/uL (ref 0.7–4.0)
MCH: 30.9 pg (ref 26.0–34.0)
MCHC: 33.4 g/dL (ref 30.0–36.0)
MCV: 92.5 fL (ref 80.0–100.0)
Monocytes Absolute: 0.6 10*3/uL (ref 0.1–1.0)
Monocytes Relative: 10 %
Neutro Abs: 3.8 10*3/uL (ref 1.7–7.7)
Neutrophils Relative %: 65 %
Platelets: 329 10*3/uL (ref 150–400)
RBC: 3.88 MIL/uL (ref 3.87–5.11)
RDW: 14.4 % (ref 11.5–15.5)
WBC: 5.8 10*3/uL (ref 4.0–10.5)
nRBC: 0 % (ref 0.0–0.2)

## 2021-11-22 LAB — RAPID URINE DRUG SCREEN, HOSP PERFORMED
Amphetamines: NOT DETECTED
Barbiturates: NOT DETECTED
Benzodiazepines: NOT DETECTED
Cocaine: NOT DETECTED
Opiates: NOT DETECTED
Tetrahydrocannabinol: NOT DETECTED

## 2021-11-22 LAB — URINALYSIS, MICROSCOPIC (REFLEX)

## 2021-11-22 LAB — CBG MONITORING, ED: Glucose-Capillary: 136 mg/dL — ABNORMAL HIGH (ref 70–99)

## 2021-11-22 LAB — ETHANOL: Alcohol, Ethyl (B): <10 mg/dL (ref <10)

## 2021-11-22 LAB — SALICYLATE LEVEL: Salicylate Lvl: <7 mg/dL — ABNORMAL LOW (ref 7.0–30.0)

## 2021-11-22 LAB — ACETAMINOPHEN LEVEL: Acetaminophen (Tylenol), Serum: <10 ug/mL — ABNORMAL LOW (ref 10–30)

## 2021-11-22 MED ORDER — SODIUM CHLORIDE 0.9 % IV SOLN
1.0000 g | Freq: Once | INTRAVENOUS | Status: AC
  Administered 2021-11-22: 1 g via INTRAVENOUS
  Filled 2021-11-22: qty 10

## 2021-11-22 MED ORDER — ONDANSETRON HCL 4 MG/2ML IJ SOLN
4.0000 mg | Freq: Once | INTRAMUSCULAR | Status: DC
  Filled 2021-11-22: qty 2

## 2021-11-22 MED ORDER — CEPHALEXIN 500 MG PO CAPS: 500.0000 mg | ORAL_CAPSULE | Freq: Two times a day (BID) | ORAL | 0 refills | Status: AC

## 2021-11-22 MED ORDER — SODIUM CHLORIDE 0.9 % IV BOLUS
500.0000 mL | Freq: Once | INTRAVENOUS | Status: AC
  Administered 2021-11-22: 500 mL via INTRAVENOUS

## 2021-11-22 NOTE — ED Provider Notes (Signed)
?[facility] ?Provider Note ? ? ?CSN: [id number] ?Arrival date & time: 11/22/21  0751 ? ?  ? ?History ? ?Chief Complaint  ?Patient presents with  ? Hallucinations  ? ? ?Carmen Davies is a 71 y.o. female. ? ?71 y.o female with a PMH HTN, DM presents to the ED with a chief complaint of visual hallucinations.  History obtained from husband, who reports patient keeps stating "they are spraying poison and me ", my husband has had people over the past 2 nights ".  Bicycles and cats are also coming out of the wall.  Patient had back surgery on 3/172023 by Dr. Venetia Constable, reports she has had no pain or complaints after the surgery.  She was placed on codeine, however discontinued taking this approximately 4 days ago as it was not making her feel well.  She also endorses her urine being darker after surgery.  Does have an appointment with her neurosurgeon on the upcoming Friday.  She has not eaten or slept in the last 2 days.  Her last bowel movement was yesterday was within normal limits.  She also endorses 1 episode of vomiting this morning, nonbilious, nonbloody.  Does feel clammy.  No fever, no chest pain, no shortness of breath, no abdominal pain. ? ? ?The history is provided by the patient, medical records and a relative.  ? ?  ? ?Home Medications ?Prior to Admission medications   ?Medication Sig Start Date End Date Taking? Authorizing Provider  ?cephALEXin (KEFLEX) 500 MG capsule Take 1 capsule (500 mg total) by mouth 2 (two) times daily for 7 days. 11/22/21 11/29/21 Yes Finlay Mills, Beverley Fiedler, PA-C  ?amLODipine (NORVASC) 5 MG tablet Take 5 mg by mouth daily. 06/10/21   [provider]  ?aspirin 81 MG EC tablet Take 81 mg by mouth daily.    [provider]  ?atorvastatin (LIPITOR) 10 MG tablet Take 10 mg by mouth daily.    [provider]  ?cholecalciferol (VITAMIN D) 25 MCG (1000 UNIT) tablet Take 1,000 Units by mouth daily.    [provider]  ?Continuous  Blood Gluc Sensor (FREESTYLE LIBRE 14 DAY SENSOR) MISC Apply topically daily. 09/03/21   [provider]  ?Cyanocobalamin (VITAMIN B12) 1000 MCG TBCR Take 1,000 mcg by mouth daily.    [provider]  ?cyclobenzaprine (FLEXERIL) 10 MG tablet Take 1 tablet (10 mg total) by mouth 3 (three) times daily as needed for muscle spasms. 11/14/21   Judith Part, MD  ?gabapentin (NEURONTIN) 300 MG capsule Take 300 mg by mouth at bedtime as needed (pain). ?Patient not taking: Reported on 11/04/2021    [provider]  ?hydrochlorothiazide (HYDRODIURIL) 12.5 MG tablet Take 12.5 mg by mouth every morning. 06/10/21   [provider]  ?metFORMIN (GLUCOPHAGE) 500 MG tablet Take 1,000 mg by mouth once.    [provider]  ?Multiple Vitamins-Minerals (HAIR/SKIN/NAILS/BIOTIN PO) Take 1 capsule by mouth daily.    [provider]  ?Multiple Vitamins-Minerals (MULTIVITAMIN WITH MINERALS) tablet Take 1 tablet by mouth daily.    [provider]  ?oxyCODONE (OXY IR/ROXICODONE) 5 MG immediate release tablet Take 1 tablet (5 mg total) by mouth every 4 (four) hours as needed (pain). 11/14/21   Judith Part, MD  ?potassium chloride (KLOR-CON) 10 MEQ tablet Take 10 mEq by mouth every morning. 06/10/21   [provider]  ?valsartan (DIOVAN) 160 MG tablet Take 160 mg by mouth daily. 06/10/21   [provider]  ?   ? ?  Allergies    ?Codeine   ? ?Review of Systems   ?Review of Systems  ?Constitutional:  Positive for diaphoresis. Negative for chills and fever.  ?HENT:  Negative for sore throat.   ?Respiratory:  Negative for shortness of breath.   ?Cardiovascular:  Negative for chest pain.  ?Gastrointestinal:  Positive for vomiting. Negative for abdominal pain and nausea.  ?Genitourinary:  Negative for flank pain.  ?Musculoskeletal:  Negative for back pain.  ?Neurological:  Negative for light-headedness and headaches.  ?All other systems reviewed and are  negative. ? ?Physical Exam ?Updated Vital Signs ?BP 120/73   Pulse 84   Temp 98.9 ?F (37.2 ?C) (Oral)   Resp (!) 24   SpO2 98%  ?Physical Exam ?Vitals and nursing note reviewed.  ?Constitutional:   ?   Appearance: Normal appearance. She is diaphoretic.  ?HENT:  ?   Head: Normocephalic and atraumatic.  ?   Mouth/Throat:  ?   Mouth: Mucous membranes are moist.  ?Cardiovascular:  ?   Rate and Rhythm: Normal rate.  ?Pulmonary:  ?   Effort: Pulmonary effort is normal.  ?   Breath sounds: No wheezing or rales.  ?Abdominal:  ?   General: Abdomen is flat.  ?   Tenderness: There is no abdominal tenderness. There is no right CVA tenderness or left CVA tenderness.  ?Musculoskeletal:  ?   Cervical back: Normal range of motion and neck supple.  ?     Back: ? ?Skin: ?   General: Skin is warm.  ?Neurological:  ?   Mental Status: She is alert and oriented to person, place, and time.  ? ? ?ED Results / Procedures / Treatments   ?Labs ?(all labs ordered are listed, but only abnormal results are displayed) ?Labs Reviewed  ?CBC WITH DIFFERENTIAL/PLATELET - Abnormal; Notable for the following components:  ?    Result Value  ? HCT 35.9 (*)   ? All other components within normal limits  ?COMPREHENSIVE METABOLIC PANEL - Abnormal; Notable for the following components:  ? Glucose, Bld 143 (*)   ? BUN 28 (*)   ? Total Protein 8.6 (*)   ? All other components within normal limits  ?URINALYSIS, ROUTINE W REFLEX MICROSCOPIC - Abnormal; Notable for the following components:  ? Hgb urine dipstick TRACE (*)   ? Protein, ur TRACE (*)   ? Nitrite POSITIVE (*)   ? Leukocytes,Ua MODERATE (*)   ? All other components within normal limits  ?SALICYLATE LEVEL - Abnormal; Notable for the following components:  ? Salicylate Lvl <4.9 (*)   ? All other components within normal limits  ?ACETAMINOPHEN LEVEL - Abnormal; Notable for the following components:  ? Acetaminophen (Tylenol), Serum <10 (*)   ? All other components within normal limits  ?URINALYSIS,  MICROSCOPIC (REFLEX) - Abnormal; Notable for the following components:  ? Bacteria, UA MANY (*)   ? All other components within normal limits  ?CBG MONITORING, ED - Abnormal; Notable for the following components:  ? Glucose-Capillary 136 (*)   ? All other components within normal limits  ?URINE CULTURE  ?ETHANOL  ?RAPID URINE DRUG SCREEN, HOSP PERFORMED  ? ? ?EKG ?None ? ?Radiology ?DG Chest 2 View ? ?Result Date: 11/22/2021 ?CLINICAL DATA:  Altered mental status. EXAM: CHEST - 2 VIEW COMPARISON:  None. FINDINGS: Cardiac silhouette is normal in size. Normal mediastinal and hilar contours. Clear lungs.  No pleural effusion or pneumothorax. Skeletal structures are intact. IMPRESSION: No active cardiopulmonary disease. Electronically Signed  By: Lajean Manes M.D.   On: 11/22/2021 09:38  ? ?CT Head Wo Contrast ? 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 EXAM: CT HEAD WITHOUT CONTRAST TECHNIQUE: Contiguous axial images were obtained from the base of the skull through the vertex without intravenous contrast. RADIATION DOSE REDUCTION: This exam was performed according to the departmental dose-optimization program which includes automated exposure control, adjustment of the mA and/or kV according to patient size and/or use of iterative reconstruction technique. COMPARISON:  No priors. FINDINGS: Brain: No evidence of acute infarction, hemorrhage, hydrocephalus, extra-axial collection or mass lesion/mass effect. Vascular: No hyperdense vessel or unexpected calcification. Skull: Normal. Negative for fracture or focal lesion. Sinuses/Orbits: No acute finding. Other: None. IMPRESSION: 1. No acute intracranial abnormalities. The appearance of the brain is normal. Electronically Signed   By: Vinnie Langton M.D.   On: 11/22/2021 09:34   ? ?Procedures ?Procedures  ? ? ?Medications Ordered in ED ?Medications  ?ondansetron (ZOFRAN) injection 4 mg (has no administration in time range)   ?cefTRIAXone (ROCEPHIN) 1 g in sodium chloride 0.9 % 100 mL IVPB (0 g Intravenous Stopped 11/22/21 1220)  ?sodium chloride 0.9 % bolus 500 mL (500 mLs Intravenous New Bag/Given 11/22/21 1221)  ? ? ?ED Course/ [facility] [place]

## 2021-11-22 NOTE — Discharge Instructions (Addendum)
You were diagnosed with a urinary tract infection on today's visit.  We recommended admitting you to the hospital to treat this infection, however you chose outpatient treatment. ? ?I have prescribed a course of antibiotics to help treat your infection, please take 1 tablet twice a day for the next 7 days. ? ?If you experience any worsening symptoms such as fever, worsening confusion you will need to turn to the emergency department. ?

## 2021-11-22 NOTE — ED Triage Notes (Addendum)
Pt BIBA from home Pt states "they're spraying poison on me" "my husband and three other people were doing it all night long". "Bicycles and cats are coming out of the wall". Pt has not slept or eaten in the last 2X days. Pt states no hallucinations have been telling her to hurt herself or others. ? ?Aox4 ? ?BP: 159/89 ?HR: 110 ?SPO2: 97 RA ?CBG: 187 ?

## 2021-11-24 DIAGNOSIS — R410 Disorientation, unspecified: Secondary | ICD-10-CM | POA: Diagnosis not present

## 2021-11-24 DIAGNOSIS — N3001 Acute cystitis with hematuria: Secondary | ICD-10-CM | POA: Diagnosis not present

## 2021-11-26 ENCOUNTER — Telehealth (HOSPITAL_COMMUNITY): Payer: Self-pay

## 2021-11-26 NOTE — Telephone Encounter (Signed)
Pt.s mother called and requested to review the results of pt.s Urine culture.  When researched the results. It was found the urine culture was not received in the lab at [facility].  I explained the process to the patient's daughter and also that the urine culture was not completed.    The daughter verbalized understanding and was unhappy with the outcome.  The patient's daughter  is taking pt. To a follow-up with her surgeon and they will have them collect an urine specimen.  I did encourage pt.s daughter to return to the ED if needed for evaluation of patient.  Al;l questions answered.   ?

## 2021-12-10 DIAGNOSIS — E1169 Type 2 diabetes mellitus with other specified complication: Secondary | ICD-10-CM | POA: Diagnosis not present

## 2021-12-10 DIAGNOSIS — I10 Essential (primary) hypertension: Secondary | ICD-10-CM | POA: Diagnosis not present

## 2021-12-10 DIAGNOSIS — E7800 Pure hypercholesterolemia, unspecified: Secondary | ICD-10-CM | POA: Diagnosis not present

## 2021-12-22 DIAGNOSIS — E1169 Type 2 diabetes mellitus with other specified complication: Secondary | ICD-10-CM | POA: Diagnosis not present

## 2021-12-22 DIAGNOSIS — E7800 Pure hypercholesterolemia, unspecified: Secondary | ICD-10-CM | POA: Diagnosis not present

## 2021-12-22 DIAGNOSIS — Z79899 Other long term (current) drug therapy: Secondary | ICD-10-CM | POA: Diagnosis not present

## 2021-12-23 DIAGNOSIS — I10 Essential (primary) hypertension: Secondary | ICD-10-CM | POA: Diagnosis not present

## 2021-12-23 DIAGNOSIS — I700 Atherosclerosis of aorta: Secondary | ICD-10-CM | POA: Diagnosis not present

## 2021-12-23 DIAGNOSIS — E7800 Pure hypercholesterolemia, unspecified: Secondary | ICD-10-CM | POA: Diagnosis not present

## 2021-12-23 DIAGNOSIS — E1169 Type 2 diabetes mellitus with other specified complication: Secondary | ICD-10-CM | POA: Diagnosis not present

## 2022-02-15 NOTE — Therapy (Signed)
OUTPATIENT PHYSICAL THERAPY THORACOLUMBAR EVALUATION   Patient Name: Carmen Davies MRN: [id number] DOB:Jul 11, 1951, 71 y.o., female Today's Date: 02/17/2022   PT End of Session - 02/16/22 1007     Visit Number 1    Number of Visits 16    Date for PT Re-Evaluation 04/13/22    Authorization Type HTA    PT Start Time 0850    PT Stop Time 0940    PT Time Calculation (min) 50 min    Activity Tolerance Patient tolerated treatment well    Behavior During Therapy [facility] for tasks assessed/performed             Past Medical History:  Diagnosis Date   COVID-19 virus detected 10/20/2021   Diabetes mellitus without complication ([place])    Type II, controls with diet   Diverticulitis    Hyperlipidemia    Hypertension    Varicose veins    Past Surgical History:  Procedure Laterality Date   ABDOMINAL HYSTERECTOMY     TRANSFORAMINAL LUMBAR INTERBODY FUSION W/ MIS 1 LEVEL Left 11/13/2021   Procedure: Left Lumbar five-Sacral one Minimally Invasive Transforminal Lumbar Interbody Fusion;  Surgeon: Judith Part, MD;  Location: [place];  Service: Neurosurgery;  Laterality: Left;   Patient Active Problem List   Diagnosis Date Noted   Lumbar radiculopathy 11/13/2021   Preop cardiovascular exam 11/04/2021   LBBB (left bundle branch block) 11/04/2021   Murmur 11/04/2021   Controlled type 2 diabetes with neuropathy ([place]) 11/04/2021   Primary hypertension 11/04/2021   Mixed hyperlipidemia 11/04/2021   Varicose veins of leg with complications 64/68/0321   Varicose veins of lower extremities with other complications 22/48/2500   DM 06/28/2008   COLONIC POLYPS 03/20/2008   HYPERCHOLESTEROLEMIA 03/20/2008   ANXIETY 03/20/2008   HYPERTENSION 03/20/2008   ASTHMATIC BRONCHITIS, ACUTE 03/20/2008   ALLERGIC RHINITIS 03/20/2008   PEPTIC ULCER DISEASE 03/20/2008   DEGENERATIVE JOINT DISEASE 03/20/2008    PCP: Lajean Manes, MD  REFERRING PROVIDER: Judith Part, MD   REFERRING DIAG:  M54.16 (ICD-10-CM) - Radiculopathy, lumbar region   Rationale for Evaluation and Treatment Rehabilitation  THERAPY DIAG:  Pain in right leg  Muscle weakness (generalized)  Difficulty in walking, not elsewhere classified  ONSET DATE: DOS 11/13/2021  SUBJECTIVE:                                                                                                                                                                                           SUBJECTIVE STATEMENT: Pt reports her pain began  a little over 1 year ago.  She first noticed pain when she was cleaning her baseboards.  Pt attempted PT and states it didn't help.  Pt underwent Left L5-S1 minimally invasive transforaminal lumbar interbody fusion on 11/13/2021.  Pt saw MD last month and pt states she was informed she could do what she felt comfortable.   Pt states she has not been having back pain since surgery.  Pt reports her mobility was better after surgery though now is worse.  Pt states her ability to perform transfers is getting worse.  Pt has difficulty with performing transfers including sit/stand and car transfers.  Pt walks on TM for 1 hour per day typically.  Pt states she has a limp with gait and is limited with ambulating extended distance.  Pt able to ambulate in grocery store with cart without difficulty.  Pt is limited with performing household chores including cleaning, watering her flowers, and making her bed.  Pt is not performing bending and has increased pain if she attempts it.     PERTINENT HISTORY:  L5-S1 fusion on 11/13/2021.  DM  PAIN:  Are you having pain? None at rest. Current:  0/10, Worst:  6-7/10 with bending.  3/10 pain with sit to stand transfers. Location:  R post hip and R posterior and lateral R thigh to her knee.   PRECAUTIONS: Other: Lumbar Fusion  WEIGHT BEARING RESTRICTIONS No  FALLS:  Has patient fallen in last 6 months? No   OCCUPATION: Pt is retired  PLOF: Independent   PATIENT  GOALS Pt states she loves cleaning her house and wants to be able to clean her baseboards.  Improved ability to perform sit/stand transfers and perform car transfers.     OBJECTIVE:   DIAGNOSTIC FINDINGS:  Pt is post op  PATIENT SURVEYS:  FOTO Pt filled out FOTO though no score available   COGNITION:  Overall cognitive status: Within functional limits for tasks assessed     OBSERVATION: Pt has a glucose sensor on post shoulder.   SENSATION: LT:  L2: 2+ bilat ; L3-S2 1+ on R and 2+ on L   PALPATION: Will assess next visit   LOWER EXTREMITY STRENGTH:     MMT Right eval Left eval  Hip flexion 4/5 5/5  Hip extension    Hip abduction    Hip adduction    Hip internal rotation    Hip external rotation 4-/5 4+/5  Knee flexion 5/5 tested in sitting 5/5 tested in sitting  Knee extension 4+/5 5/5  Ankle dorsiflexion    Ankle plantarflexion    Ankle inversion    Ankle eversion     (Blank rows = not tested)   FUNCTIONAL TESTS:  Pt has difficulty and is slow with performing sit to stand transfers.  3/10 pain.    GAIT: Assistive device utilized: None Level of assistance: Complete Independence Comments: antalgic gait.  Decresed step length bilat, limited pelvic rotation and limited arm swing bilat, favors R LE    TODAY'S TREATMENT  PT educated pt in correct performance and palpation of TrA contraction.  Pt performed supine TrA contractions with and without 5 sec hold.  Pt performed supine marching with TrA x10 reps. Pt educated in and performed log roll.  Pt received a HEP handout and was educated in correct form and appropriate frequency.    PATIENT EDUCATION:  Education details: HEP, POC, objective findings, dx, relevant anatomy, bed mobility, and prognosis. Person educated: Patient Education method: Explanation, Demonstration, Tactile cues, Verbal cues, and Handouts Education comprehension: verbalized understanding, returned demonstration, verbal cues required,  tactile cues required, and  needs further education   HOME EXERCISE PROGRAM: Access Code: [email or web address] URL: [email or web address] Date: 02/16/2022 Prepared by: Ronny Flurry  Exercises - Supine Transversus Abdominis Bracing - Hands on Stomach  - 2-3 x daily - 7 x weekly - 1-2 sets - 10 reps - 3-5 seconds hold - Supine March  - 1-2 x daily - 7 x weekly - 2 sets - 10 reps - Log Roll  - 1 x daily - 1 x weekly - 1 sets - 1 reps  ASSESSMENT:  CLINICAL IMPRESSION: Patient is a 71 y.o. female 13 weeks and 4 days s/p L5-S1 transforaminal lumbar interbody fusion presenting to the clinic with R leg pain, muscle weakness, LE's, and difficulty in walking.  Pt states she has not been having back pain since surgery.  Pt reports her mobility was better after surgery though now is worse.  Pt has difficulty with performing transfers including sit/stand and car transfers.  Pt has gait deficits including a limp and is limited with ambulating extended distance.  Pt is not performing bending and is limited with performing household chores.  Pt should benefit from skilled PT services to address impairments and to improve overall function.     OBJECTIVE IMPAIRMENTS Abnormal gait, decreased activity tolerance, decreased endurance, decreased mobility, difficulty walking, decreased strength, hypomobility, impaired flexibility, impaired sensation, and pain.   ACTIVITY LIMITATIONS carrying, lifting, bending, standing, squatting, transfers, bed mobility, and locomotion level  PARTICIPATION LIMITATIONS: meal prep, cleaning, laundry, shopping, community activity, and yard work  PERSONAL FACTORS 1 comorbidity: DM  are also affecting patient's functional outcome.   REHAB POTENTIAL: Good  CLINICAL DECISION MAKING: Stable/uncomplicated  EVALUATION COMPLEXITY: Low   GOALS:   SHORT TERM GOALS: Target date: 03/09/2022  Pt will be independent and compliant with HEP for improved pain, strength, and  function.  Baseline: Goal status: INITIAL  2.  Pt will demo improved ease with sit to stand transfers and report reduced pain for improved mobility. Baseline:  Goal status: INITIAL  3.  Pt will demo correct form with log rolling for improved bed mobility. Baseline:  Goal status: INITIAL Goal Status:  03/02/2022  4.  Pt will demo improved core strength as evidenced by progression of core exercises without adverse effects for improved performance of and tolerance with daily activities and functional mobility   Baseline:  Goal status: INITIAL    LONG TERM GOALS: Target date:  04/13/2022  Pt will ambulate with a normalized gait pattern with = Wb'ing without limping  Baseline:  Goal status: INITIAL  2.  Pt will report she is able to ambulate extended community distance without significant pain or difficulty.  Baseline:  Goal status: INITIAL  3.  Pt will be able to perform her normal daily transfers without difficulty.  Baseline:  Goal status: INITIAL  4.  Pt will be able to perform appropriate household chores without significant pain or difficulty.  Baseline:  Goal status: INITIAL  5.  Pt will demo improved R LE strength to = L LE for improved functional mobility. Baseline:  Goal status: INITIAL     PLAN: PT FREQUENCY: 2x/week  PT DURATION: 8 weeks  PLANNED INTERVENTIONS: Therapeutic exercises, Therapeutic activity, Neuromuscular re-education, Balance training, Gait training, Patient/Family education, Joint mobilization, Stair training, Aquatic Therapy, Dry Needling, Electrical stimulation, Cryotherapy, Moist heat, Taping, Manual therapy, and Re-evaluation.  PLAN FOR NEXT SESSION: Review and perform HEP including log roll.  Progress core strengthening per lumbar fusion protocol.  Assess tenderness to palpation.  Assess 5x sit  to stand test.  Give FOTO again.   Selinda Michaels III PT, DPT 02/17/22 11:28 AM

## 2022-02-16 ENCOUNTER — Ambulatory Visit (HOSPITAL_BASED_OUTPATIENT_CLINIC_OR_DEPARTMENT_OTHER): Payer: PPO | Attending: Neurological Surgery | Admitting: Physical Therapy

## 2022-02-16 ENCOUNTER — Encounter (HOSPITAL_BASED_OUTPATIENT_CLINIC_OR_DEPARTMENT_OTHER): Payer: Self-pay | Admitting: Physical Therapy

## 2022-02-16 DIAGNOSIS — M6281 Muscle weakness (generalized): Secondary | ICD-10-CM | POA: Diagnosis not present

## 2022-02-16 DIAGNOSIS — M79604 Pain in right leg: Secondary | ICD-10-CM | POA: Insufficient documentation

## 2022-02-16 DIAGNOSIS — R262 Difficulty in walking, not elsewhere classified: Secondary | ICD-10-CM | POA: Insufficient documentation

## 2022-02-23 ENCOUNTER — Encounter (HOSPITAL_BASED_OUTPATIENT_CLINIC_OR_DEPARTMENT_OTHER): Payer: Self-pay | Admitting: Physical Therapy

## 2022-02-24 ENCOUNTER — Ambulatory Visit (HOSPITAL_BASED_OUTPATIENT_CLINIC_OR_DEPARTMENT_OTHER): Payer: PPO | Admitting: Physical Therapy

## 2022-02-24 DIAGNOSIS — M6281 Muscle weakness (generalized): Secondary | ICD-10-CM

## 2022-02-24 DIAGNOSIS — R262 Difficulty in walking, not elsewhere classified: Secondary | ICD-10-CM

## 2022-02-24 DIAGNOSIS — M79604 Pain in right leg: Secondary | ICD-10-CM

## 2022-02-24 NOTE — Therapy (Signed)
OUTPATIENT PHYSICAL THERAPY TREATMENT NOTE   Patient Name: Carmen Davies MRN: [id number] DOB:1951/07/06, 71 y.o., female Today's Date: 02/25/2022    END OF SESSION:   PT End of Session - 02/24/22 1555     Visit Number 2    Number of Visits 16    Date for PT Re-Evaluation 04/13/22    Authorization Type HTA    PT Start Time 1540    PT Stop Time 1615    PT Time Calculation (min) 35 min    Activity Tolerance Patient tolerated treatment well    Behavior During Therapy [facility] for tasks assessed/performed             Past Medical History:  Diagnosis Date   COVID-19 virus detected 10/20/2021   Diabetes mellitus without complication ([place])    Type II, controls with diet   Diverticulitis    Hyperlipidemia    Hypertension    Varicose veins    Past Surgical History:  Procedure Laterality Date   ABDOMINAL HYSTERECTOMY     TRANSFORAMINAL LUMBAR INTERBODY FUSION W/ MIS 1 LEVEL Left 11/13/2021   Procedure: Left Lumbar five-Sacral one Minimally Invasive Transforminal Lumbar Interbody Fusion;  Surgeon: Judith Part, MD;  Location: [place];  Service: Neurosurgery;  Laterality: Left;   Patient Active Problem List   Diagnosis Date Noted   Lumbar radiculopathy 11/13/2021   Preop cardiovascular exam 11/04/2021   LBBB (left bundle branch block) 11/04/2021   Murmur 11/04/2021   Controlled type 2 diabetes with neuropathy ([place]) 11/04/2021   Primary hypertension 11/04/2021   Mixed hyperlipidemia 11/04/2021   Varicose veins of leg with complications 20/25/4270   Varicose veins of lower extremities with other complications 62/37/6283   DM 06/28/2008   COLONIC POLYPS 03/20/2008   HYPERCHOLESTEROLEMIA 03/20/2008   ANXIETY 03/20/2008   HYPERTENSION 03/20/2008   ASTHMATIC BRONCHITIS, ACUTE 03/20/2008   ALLERGIC RHINITIS 03/20/2008   PEPTIC ULCER DISEASE 03/20/2008   DEGENERATIVE JOINT DISEASE 03/20/2008    PCP: Lajean Manes, MD   REFERRING PROVIDER: Judith Part, MD     REFERRING DIAG: M54.16 (ICD-10-CM) - Radiculopathy, lumbar region    Rationale for Evaluation and Treatment Rehabilitation   THERAPY DIAG:  Pain in right leg   Muscle weakness (generalized)   Difficulty in walking, not elsewhere classified   ONSET DATE: DOS 11/13/2021   SUBJECTIVE:                                                                                                                                                                                            SUBJECTIVE STATEMENT: Pt underwent Left L5-S1 minimally invasive transforaminal lumbar  interbody fusion on 11/13/2021.  Pt saw MD last month and pt states she was informed she could do what she felt comfortable.    Pt is 14 weeks and 5 days s/p L5-S1 fusion.  Pt reports improved ability to perform sit/stand transfers though continues to have difficulty.  Pt states she is able to get into bed well.  Pt reports compliance with HEP.  Pt states she has a limp with gait and is limited with ambulating extended distance.  Pt able to ambulate in grocery store with cart without difficulty.  Pt is limited with performing household chores including cleaning, watering her flowers, and making her bed.       PERTINENT HISTORY:  L5-S1 fusion on 11/13/2021.  DM   PAIN:  Are you having pain? None at rest. Current:  0/10, Worst:  6-7/10 with bending.  3/10 pain with sit to stand transfers. Location:  R post hip and R posterior and lateral R thigh to her knee.     PRECAUTIONS: Other: Lumbar Fusion   WEIGHT BEARING RESTRICTIONS No   FALLS:  Has patient fallen in last 6 months? No     OCCUPATION: Pt is retired   PLOF: Independent     PATIENT GOALS Pt states she loves cleaning her house and wants to be able to clean her baseboards.  Improved ability to perform sit/stand transfers and perform car transfers.       OBJECTIVE:    DIAGNOSTIC FINDINGS:  Pt is post op   TODAY'S TREATMENT   PATIENT SURVEYS:  Pt completed FOTO: 60  with a goal of 62 at visit #11.      PALPATION: Pt had no tenderness with palpating bilat lumbar flanks and central lumbar.  HS flexibility:  WFL      5x STS Test:  21 seconds with bilat UE support    GAIT: Assistive device utilized: None Level of assistance: Complete Independence Comments: antalgic gait.  Decresed step length bilat, limited pelvic rotation and limited arm swing bilat, favors R LE     Pt performed: Supine TrA contraction with 3-5 sec hold Supine marching with TrA 2x10  Supine alt LE ext with TrA 2x10 Supine alt UE/LE approx 15 reps Standing rows with TrA and retraction with RTB and GTB x 15 reps each.  PT reviewed and pt performed log roll    PATIENT EDUCATION:  Education details: PT updated HEP and gave pt an updated HEP handout.  Educated pt in correct form and appropriate frequency. POC, dx, relevant anatomy, and bed mobility. Person educated: Patient Education method: Explanation, Demonstration, Tactile cues, Verbal cues, and Handouts Education comprehension: verbalized understanding, returned demonstration, verbal cues required, tactile cues required, and needs further education     HOME EXERCISE PROGRAM: Access Code: [email or web address] URL: [email or web address] Date: 02/16/2022 Prepared by: Ronny Flurry   Exercises - Supine Transversus Abdominis Bracing - Hands on Stomach  - 2-3 x daily - 7 x weekly - 1-2 sets - 10 reps - 3-5 seconds hold - Supine March  - 1-2 x daily - 7 x weekly - 2 sets - 10 reps - Log Roll  - 1 x daily - 1 x weekly - 1 sets - 1 reps Updated HEP: - Supine Transversus Abdominis Bracing with Leg Extension  - 1 x daily - 7 x weekly - 2 sets - 10 reps - Hooklying Clamshell with Resistance  - 1 x daily - 4-5 x weekly - 2 sets - 10 reps - Standing Shoulder Row  with Anchored Resistance  - 1 x daily - 4-5 x weekly - 2 sets - 10 reps   ASSESSMENT:   CLINICAL IMPRESSION: PT reviewed log roll and HEP.  Pt performed HEP well  with cuing for correct form.  PT progressed core exercises today and pt tolerated progression well without c/o's.  PT updated HEP.  Pt had no tenderness with palpating lumbar paraspinals and had good flexibility in bilat HS.  It required increased time for pt to perform 5x STS test and she had to use bilat UE's.  Pt responded well to Rx reporting no increased pain after Rx.  Pt should benefit from skilled PT services to address impairments and to improve overall function.        OBJECTIVE IMPAIRMENTS Abnormal gait, decreased activity tolerance, decreased endurance, decreased mobility, difficulty walking, decreased strength, hypomobility, impaired flexibility, impaired sensation, and pain.    ACTIVITY LIMITATIONS carrying, lifting, bending, standing, squatting, transfers, bed mobility, and locomotion level   PARTICIPATION LIMITATIONS: meal prep, cleaning, laundry, shopping, community activity, and yard work   PERSONAL FACTORS 1 comorbidity: DM  are also affecting patient's functional outcome.    REHAB POTENTIAL: Good   CLINICAL DECISION MAKING: Stable/uncomplicated   EVALUATION COMPLEXITY: Low     GOALS:     SHORT TERM GOALS: Target date: 03/09/2022   Pt will be independent and compliant with HEP for improved pain, strength, and function.  Baseline: Goal status: INITIAL   2.  Pt will demo improved ease with sit to stand transfers and report reduced pain for improved mobility. Baseline:  Goal status: INITIAL   3.  Pt will demo correct form with log rolling for improved bed mobility. Baseline:  Goal status: INITIAL Goal Status:  03/02/2022   4.  Pt will demo improved core strength as evidenced by progression of core exercises without adverse effects for improved performance of and tolerance with daily activities and functional mobility   Baseline:  Goal status: INITIAL       LONG TERM GOALS: Target date:  04/13/2022   Pt will ambulate with a normalized gait pattern with = Wb'ing  without limping  Baseline:  Goal status: INITIAL   2.  Pt will report she is able to ambulate extended community distance without significant pain or difficulty.  Baseline:  Goal status: INITIAL   3.  Pt will be able to perform her normal daily transfers without difficulty.  Baseline:  Goal status: INITIAL   4.  Pt will be able to perform appropriate household chores without significant pain or difficulty.  Baseline:  Goal status: INITIAL   5.  Pt will demo improved R LE strength to = L LE for improved functional mobility. Baseline:  Goal status: INITIAL         PLAN: PT FREQUENCY: 2x/week   PT DURATION: 8 weeks   PLANNED INTERVENTIONS: Therapeutic exercises, Therapeutic activity, Neuromuscular re-education, Balance training, Gait training, Patient/Family education, Joint mobilization, Stair training, Aquatic Therapy, Dry Needling, Electrical stimulation, Cryotherapy, Moist heat, Taping, Manual therapy, and Re-evaluation.   PLAN FOR NEXT SESSION: Review and perform HEP.  Progress core strengthening per lumbar fusion protocol.     Selinda Michaels III PT, DPT 02/25/22 9:39 PM

## 2022-02-25 ENCOUNTER — Encounter (HOSPITAL_BASED_OUTPATIENT_CLINIC_OR_DEPARTMENT_OTHER): Payer: Self-pay | Admitting: Physical Therapy

## 2022-02-26 ENCOUNTER — Encounter (HOSPITAL_BASED_OUTPATIENT_CLINIC_OR_DEPARTMENT_OTHER): Payer: PPO | Admitting: Physical Therapy

## 2022-03-05 ENCOUNTER — Encounter (HOSPITAL_BASED_OUTPATIENT_CLINIC_OR_DEPARTMENT_OTHER): Payer: Self-pay | Admitting: Physical Therapy

## 2022-03-05 ENCOUNTER — Ambulatory Visit (HOSPITAL_BASED_OUTPATIENT_CLINIC_OR_DEPARTMENT_OTHER): Payer: PPO | Attending: Neurological Surgery | Admitting: Physical Therapy

## 2022-03-05 DIAGNOSIS — R262 Difficulty in walking, not elsewhere classified: Secondary | ICD-10-CM | POA: Diagnosis not present

## 2022-03-05 DIAGNOSIS — M6281 Muscle weakness (generalized): Secondary | ICD-10-CM | POA: Diagnosis not present

## 2022-03-05 DIAGNOSIS — M79604 Pain in right leg: Secondary | ICD-10-CM | POA: Insufficient documentation

## 2022-03-05 NOTE — Therapy (Signed)
OUTPATIENT PHYSICAL THERAPY TREATMENT NOTE   Patient Name: Carmen Davies MRN: [id number] DOB:November 08, 1950, 71 y.o., female Today's Date: 03/06/2022    END OF SESSION:   PT End of Session - 03/05/22 1231     Visit Number 3    Number of Visits 16    Date for PT Re-Evaluation 04/13/22    Authorization Type HTA    PT Start Time 1207    PT Stop Time 1248    PT Time Calculation (min) 41 min    Activity Tolerance Patient tolerated treatment well    Behavior During Therapy [facility] for tasks assessed/performed              Past Medical History:  Diagnosis Date   COVID-19 virus detected 10/20/2021   Diabetes mellitus without complication ([place])    Type II, controls with diet   Diverticulitis    Hyperlipidemia    Hypertension    Varicose veins    Past Surgical History:  Procedure Laterality Date   ABDOMINAL HYSTERECTOMY     TRANSFORAMINAL LUMBAR INTERBODY FUSION W/ MIS 1 LEVEL Left 11/13/2021   Procedure: Left Lumbar five-Sacral one Minimally Invasive Transforminal Lumbar Interbody Fusion;  Surgeon: Judith Part, MD;  Location: [place];  Service: Neurosurgery;  Laterality: Left;   Patient Active Problem List   Diagnosis Date Noted   Lumbar radiculopathy 11/13/2021   Preop cardiovascular exam 11/04/2021   LBBB (left bundle branch block) 11/04/2021   Murmur 11/04/2021   Controlled type 2 diabetes with neuropathy ([place]) 11/04/2021   Primary hypertension 11/04/2021   Mixed hyperlipidemia 11/04/2021   Varicose veins of leg with complications 35/70/1779   Varicose veins of lower extremities with other complications 39/10/90   DM 06/28/2008   COLONIC POLYPS 03/20/2008   HYPERCHOLESTEROLEMIA 03/20/2008   ANXIETY 03/20/2008   HYPERTENSION 03/20/2008   ASTHMATIC BRONCHITIS, ACUTE 03/20/2008   ALLERGIC RHINITIS 03/20/2008   PEPTIC ULCER DISEASE 03/20/2008   DEGENERATIVE JOINT DISEASE 03/20/2008    PCP: Lajean Manes, MD   REFERRING PROVIDER: Judith Part, MD     REFERRING DIAG: M54.16 (ICD-10-CM) - Radiculopathy, lumbar region    Rationale for Evaluation and Treatment Rehabilitation   THERAPY DIAG:  Pain in right leg   Muscle weakness (generalized)   Difficulty in walking, not elsewhere classified   ONSET DATE: DOS 11/13/2021   SUBJECTIVE:                                                                                                                                                                                            SUBJECTIVE STATEMENT: Pt underwent Left L5-S1 minimally invasive transforaminal  lumbar interbody fusion on 11/13/2021.  Pt saw MD last month and pt states she was informed she could do what she felt comfortable.    Pt is 16 weeks and 5 days s/p L5-S1 fusion.  Pt denies any adverse effects after prior Rx. Pt states she is improving.  Pt reports improved ability to perform sit/stand transfers though continues to have difficulty.  Pt states she is able to get into bed well though does have some difficulty into bed due to it the height.  Pt reports compliance with HEP.  Pt reports improved gait and is ambulation on TM for 1 hour Mon-Thurs. Pt able to ambulate in grocery store with cart without difficulty.  Pt is limited with performing household chores including cleaning, watering her flowers, and making her bed.   Pt reports she gets stiff after sitting.  She has some pain with initially walking after sitting.  Pt states she typically feels better the more she does.      PERTINENT HISTORY:  L5-S1 fusion on 11/13/2021.  DM   PAIN:  Are you having pain? None at rest. Current:  0/10, Worst:  6-7/10 with bending.  3/10 pain with sit to stand transfers. Location:  R post hip and R posterior and lateral R thigh to her knee.     PRECAUTIONS: Other: Lumbar Fusion   WEIGHT BEARING RESTRICTIONS No   FALLS:  Has patient fallen in last 6 months? No     OCCUPATION: Pt is retired   PLOF: Independent     PATIENT GOALS Pt states she  loves cleaning her house and wants to be able to clean her baseboards.  Improved ability to perform sit/stand transfers and perform car transfers.       OBJECTIVE:    DIAGNOSTIC FINDINGS:  Pt is post op   TODAY'S TREATMENT   Therapeutic exercise:  Reviewed current function, response to prior Rx, and pain levels.  Pt performed: Supine alt LE ext with TrA 2x10 Supine alt UE/LE with Tra approx 2x10 reps Supine bridge with TrA 3x10 Supine clamshells with TrA with RTB x 15 reps and wit GTB 2x10 reps Supine SLR with TrA 2x10 reps Standing rows with TrA and retraction with RTB and GTB 2 x 15 reps Standing shoulder extension with TrA and retraction with RTB 2x10 reps Attempted mini squats though stopped due to form Sit to stands without UE support from table Heel raises with Tra 2x10 reps  Updated HEP and gave pt a HEP handout.       PATIENT EDUCATION:  Education details: PT updated HEP and gave pt an updated HEP handout.  Educated pt in correct form and appropriate frequency. POC, dx, relevant anatomy. Person educated: Patient Education method: Explanation, Demonstration, Tactile cues, Verbal cues, and Handouts Education comprehension: verbalized understanding, returned demonstration, verbal cues required, tactile cues required, and needs further education     HOME EXERCISE PROGRAM: Access Code: [email or web address] URL: [email or web address] Date: 02/16/2022 Prepared by: Ronny Flurry   Exercises - Supine Transversus Abdominis Bracing - Hands on Stomach  - 2-3 x daily - 7 x weekly - 1-2 sets - 10 reps - 3-5 seconds hold - Supine March  - 1-2 x daily - 7 x weekly - 2 sets - 10 reps - Log Roll  - 1 x daily - 1 x weekly - 1 sets - 1 reps - Supine Transversus Abdominis Bracing with Leg Extension  - 1 x daily - 7 x weekly - 2 sets - 10 reps -  Hooklying Clamshell with Resistance  - 1 x daily - 4-5 x weekly - 2 sets - 10 reps - Standing Shoulder Row with Anchored Resistance  - 1  x daily - 4-5 x weekly - 2 sets - 10 reps Updated HEP: - Shoulder extension with resistance - Neutral  - 1 x daily - 4-5 x weekly - 2 sets - 10 reps - Supine Bridge  - 1 x daily - 5 x weekly - 2 sets - 10 reps   ASSESSMENT:   CLINICAL IMPRESSION: Pt is progressing well with functional mobility as evidenced by subjective reports.  She reports improved gait and sit/stand transfers and has increased her walking program.  PT progressed core exercises and Pt tolerated exercises well.  Pt requires much instruction and cuing for correct form with exercises.  Pt has difficulty with performing supine alt UE/LE and is unable to perform with the correct sequencing requiring extensive cuing and instruction.  Pt unable to perform mini squats with correct form but able to perform sit to stands with good form.   Pt responded well to Rx reporting no pain after Rx.  Pt should benefit from continued skilled PT services to address impairments and to improve overall function.       OBJECTIVE IMPAIRMENTS Abnormal gait, decreased activity tolerance, decreased endurance, decreased mobility, difficulty walking, decreased strength, hypomobility, impaired flexibility, impaired sensation, and pain.    ACTIVITY LIMITATIONS carrying, lifting, bending, standing, squatting, transfers, bed mobility, and locomotion level   PARTICIPATION LIMITATIONS: meal prep, cleaning, laundry, shopping, community activity, and yard work   PERSONAL FACTORS 1 comorbidity: DM  are also affecting patient's functional outcome.    REHAB POTENTIAL: Good   CLINICAL DECISION MAKING: Stable/uncomplicated   EVALUATION COMPLEXITY: Low     GOALS:     SHORT TERM GOALS: Target date: 03/09/2022   Pt will be independent and compliant with HEP for improved pain, strength, and function.  Baseline: Goal status: INITIAL   2.  Pt will demo improved ease with sit to stand transfers and report reduced pain for improved mobility. Baseline:  Goal  status: INITIAL   3.  Pt will demo correct form with log rolling for improved bed mobility. Baseline:  Goal status: INITIAL Goal Status:  03/02/2022   4.  Pt will demo improved core strength as evidenced by progression of core exercises without adverse effects for improved performance of and tolerance with daily activities and functional mobility   Baseline:  Goal status: INITIAL       LONG TERM GOALS: Target date:  04/13/2022   Pt will ambulate with a normalized gait pattern with = Wb'ing without limping  Baseline:  Goal status: INITIAL   2.  Pt will report she is able to ambulate extended community distance without significant pain or difficulty.  Baseline:  Goal status: INITIAL   3.  Pt will be able to perform her normal daily transfers without difficulty.  Baseline:  Goal status: INITIAL   4.  Pt will be able to perform appropriate household chores without significant pain or difficulty.  Baseline:  Goal status: INITIAL   5.  Pt will demo improved R LE strength to = L LE for improved functional mobility. Baseline:  Goal status: INITIAL         PLAN: PT FREQUENCY: 2x/week   PT DURATION: 8 weeks   PLANNED INTERVENTIONS: Therapeutic exercises, Therapeutic activity, Neuromuscular re-education, Balance training, Gait training, Patient/Family education, Joint mobilization, Stair training, Aquatic Therapy, Dry Needling, Electrical  stimulation, Cryotherapy, Moist heat, Taping, Manual therapy, and Re-evaluation.   PLAN FOR NEXT SESSION:  Progress core strengthening per lumbar fusion protocol.     Selinda Michaels III PT, DPT 03/06/22 12:32 PM

## 2022-03-17 ENCOUNTER — Ambulatory Visit (HOSPITAL_BASED_OUTPATIENT_CLINIC_OR_DEPARTMENT_OTHER): Payer: PPO | Admitting: Physical Therapy

## 2022-03-17 ENCOUNTER — Encounter (HOSPITAL_BASED_OUTPATIENT_CLINIC_OR_DEPARTMENT_OTHER): Payer: Self-pay | Admitting: Physical Therapy

## 2022-03-17 DIAGNOSIS — M79604 Pain in right leg: Secondary | ICD-10-CM | POA: Diagnosis not present

## 2022-03-17 DIAGNOSIS — M6281 Muscle weakness (generalized): Secondary | ICD-10-CM

## 2022-03-17 DIAGNOSIS — R262 Difficulty in walking, not elsewhere classified: Secondary | ICD-10-CM

## 2022-03-17 NOTE — Therapy (Signed)
OUTPATIENT PHYSICAL THERAPY TREATMENT NOTE   Patient Name: Carmen Davies MRN: [id number] DOB:June 15, 1951, 71 y.o., female Today's Date: 03/17/2022    END OF SESSION:   PT End of Session - 03/17/22 1023     Visit Number 4    Number of Visits 16    Date for PT Re-Evaluation 04/13/22    Authorization Type HTA    PT Start Time 0940    PT Stop Time 1020    PT Time Calculation (min) 40 min    Activity Tolerance Patient tolerated treatment well    Behavior During Therapy [facility] for tasks assessed/performed               Past Medical History:  Diagnosis Date   COVID-19 virus detected 10/20/2021   Diabetes mellitus without complication ([place])    Type II, controls with diet   Diverticulitis    Hyperlipidemia    Hypertension    Varicose veins    Past Surgical History:  Procedure Laterality Date   ABDOMINAL HYSTERECTOMY     TRANSFORAMINAL LUMBAR INTERBODY FUSION W/ MIS 1 LEVEL Left 11/13/2021   Procedure: Left Lumbar five-Sacral one Minimally Invasive Transforminal Lumbar Interbody Fusion;  Surgeon: Judith Part, MD;  Location: [place];  Service: Neurosurgery;  Laterality: Left;   Patient Active Problem List   Diagnosis Date Noted   Lumbar radiculopathy 11/13/2021   Preop cardiovascular exam 11/04/2021   LBBB (left bundle branch block) 11/04/2021   Murmur 11/04/2021   Controlled type 2 diabetes with neuropathy ([place]) 11/04/2021   Primary hypertension 11/04/2021   Mixed hyperlipidemia 11/04/2021   Varicose veins of leg with complications 30/16/0109   Varicose veins of lower extremities with other complications 32/35/5732   DM 06/28/2008   COLONIC POLYPS 03/20/2008   HYPERCHOLESTEROLEMIA 03/20/2008   ANXIETY 03/20/2008   HYPERTENSION 03/20/2008   ASTHMATIC BRONCHITIS, ACUTE 03/20/2008   ALLERGIC RHINITIS 03/20/2008   PEPTIC ULCER DISEASE 03/20/2008   DEGENERATIVE JOINT DISEASE 03/20/2008    PCP: Lajean Manes, MD   REFERRING PROVIDER: Judith Part, MD     REFERRING DIAG: M54.16 (ICD-10-CM) - Radiculopathy, lumbar region    Rationale for Evaluation and Treatment Rehabilitation   THERAPY DIAG:  Pain in right leg   Muscle weakness (generalized)   Difficulty in walking, not elsewhere classified   ONSET DATE: DOS 11/13/2021   SUBJECTIVE:                                                                                                                                                                                            SUBJECTIVE STATEMENT: Pt underwent Left L5-S1 minimally invasive  transforaminal lumbar interbody fusion on 11/13/2021.  Pt saw MD last month and pt states she was informed she could do what she felt comfortable.    Pt is 17 weeks and 5 days s/p L5-S1 fusion.  Pt states therapy is helping.  She reports improved walking.  Pt denies any adverse effects after prior Rx.  Pt states she has been trying to perform her HEP.  She was at the beach last week.  Pt states she is improving.  "I'm doing good".  Pt reports improved ability to perform sit/stand transfers though continues to have difficulty.  Pt reports improved bed mobility.  Pt reports improved gait and is ambulating on TM for 1 hour Mon-Thurs.  Pt is limited with bending.  Pt is limited with performing household chores including cleaning and watering her flowers.  She has not been making her bed though did make her bed some at the beach and was ok.   Pt reports she gets stiff after sitting.  She has some pain with initially walking after sitting.  Pt states she typically feels better the more she does.      PERTINENT HISTORY:  L5-S1 fusion on 11/13/2021.  DM   PAIN:  Are you having pain? None at rest. Current:  0/10, Worst:  6-7/10 with bending.  3/10 pain with sit to stand transfers. Location:  R post hip and R posterior and lateral R thigh to her knee.     PRECAUTIONS: Other: Lumbar Fusion   WEIGHT BEARING RESTRICTIONS No   FALLS:  Has patient fallen in last 6 months?  No     OCCUPATION: Pt is retired   PLOF: Independent     PATIENT GOALS Pt states she loves cleaning her house and wants to be able to clean her baseboards.  Improved ability to perform sit/stand transfers and perform car transfers.       OBJECTIVE:    DIAGNOSTIC FINDINGS:  Pt is post op   TODAY'S TREATMENT   Therapeutic exercise:  Reviewed current function, response to prior Rx, and pain levels.  Pt performed: Supine alt LE ext with TrA 2x10 Supine alt UE/LE with Tra approx 2x10 reps Supine bridge with TrA 3x10 Supine clamshells with TrA with GTB 2x10 reps Supine SLR with TrA 2x10 reps Standing rows with TrA and retraction with RTB and GTB 2 x 15 reps Standing shoulder extension with TrA and retraction with RTB 2x10 reps Attempted mini squats though stopped due to form Sit to stands without UE support from table with table elevated Heel raises with Tra x20 reps     PATIENT EDUCATION:  Education details: PT updated HEP and gave pt an updated HEP handout.  Educated pt in correct form and appropriate frequency. POC, dx, relevant anatomy. Person educated: Patient Education method: Explanation, Demonstration, Tactile cues, Verbal cues, and Handouts Education comprehension: verbalized understanding, returned demonstration, verbal cues required, tactile cues required, and needs further education     HOME EXERCISE PROGRAM: Access Code: [email or web address] URL: [email or web address] Date: 02/16/2022 Prepared by: Ronny Flurry   Exercises - Supine Transversus Abdominis Bracing - Hands on Stomach  - 2-3 x daily - 7 x weekly - 1-2 sets - 10 reps - 3-5 seconds hold - Supine March  - 1-2 x daily - 7 x weekly - 2 sets - 10 reps - Log Roll  - 1 x daily - 1 x weekly - 1 sets - 1 reps - Supine Transversus Abdominis Bracing with Leg Extension  - 1 x  daily - 7 x weekly - 2 sets - 10 reps - Hooklying Clamshell with Resistance  - 1 x daily - 4-5 x weekly - 2 sets - 10 reps -  Standing Shoulder Row with Anchored Resistance  - 1 x daily - 4-5 x weekly - 2 sets - 10 reps Updated HEP: - Shoulder extension with resistance - Neutral  - 1 x daily - 4-5 x weekly - 2 sets - 10 reps - Supine Bridge  - 1 x daily - 5 x weekly - 2 sets - 10 reps   ASSESSMENT:   CLINICAL IMPRESSION: Pt is progressing well with functional mobility as evidenced by subjective reports.  She reports improved gait, bed mobility, and performance of sit/stand transfers.  She is improving with core strength and tolerates exercises per protocol well.  Pt demonstrates improved form with exercises though does require cuing and instruction for correct form.  Pt unable to perform mini squats with correct form.  Pt continues to have difficulty with sit/stand transfers though was able to perform with table elevated without UE's with cuing for correct form.  Pt responded well to Rx stating  "I feel great.  The more I do, the better I feel". after Rx.  Pt should benefit from continued skilled PT services to address impairments and to improve overall function.      OBJECTIVE IMPAIRMENTS Abnormal gait, decreased activity tolerance, decreased endurance, decreased mobility, difficulty walking, decreased strength, hypomobility, impaired flexibility, impaired sensation, and pain.    ACTIVITY LIMITATIONS carrying, lifting, bending, standing, squatting, transfers, bed mobility, and locomotion level   PARTICIPATION LIMITATIONS: meal prep, cleaning, laundry, shopping, community activity, and yard work   PERSONAL FACTORS 1 comorbidity: DM  are also affecting patient's functional outcome.    REHAB POTENTIAL: Good   CLINICAL DECISION MAKING: Stable/uncomplicated   EVALUATION COMPLEXITY: Low     GOALS:     SHORT TERM GOALS: Target date: 03/09/2022   Pt will be independent and compliant with HEP for improved pain, strength, and function.  Baseline: Goal status: INITIAL   2.  Pt will demo improved ease with sit to  stand transfers and report reduced pain for improved mobility. Baseline:  Goal status: INITIAL   3.  Pt will demo correct form with log rolling for improved bed mobility. Baseline:  Goal status: INITIAL Goal Status:  03/02/2022   4.  Pt will demo improved core strength as evidenced by progression of core exercises without adverse effects for improved performance of and tolerance with daily activities and functional mobility   Baseline:  Goal status: INITIAL       LONG TERM GOALS: Target date:  04/13/2022   Pt will ambulate with a normalized gait pattern with = Wb'ing without limping  Baseline:  Goal status: INITIAL   2.  Pt will report she is able to ambulate extended community distance without significant pain or difficulty.  Baseline:  Goal status: INITIAL   3.  Pt will be able to perform her normal daily transfers without difficulty.  Baseline:  Goal status: INITIAL   4.  Pt will be able to perform appropriate household chores without significant pain or difficulty.  Baseline:  Goal status: INITIAL   5.  Pt will demo improved R LE strength to = L LE for improved functional mobility. Baseline:  Goal status: INITIAL         PLAN: PT FREQUENCY: 2x/week   PT DURATION: 8 weeks   PLANNED INTERVENTIONS: Therapeutic exercises, Therapeutic activity,  Neuromuscular re-education, Balance training, Gait training, Patient/Family education, Joint mobilization, Stair training, Aquatic Therapy, Dry Needling, Electrical stimulation, Cryotherapy, Moist heat, Taping, Manual therapy, and Re-evaluation.   PLAN FOR NEXT SESSION:  Progress core strengthening per lumbar fusion protocol.     Selinda Michaels III PT, DPT 03/17/22 11:12 PM

## 2022-03-19 ENCOUNTER — Ambulatory Visit (HOSPITAL_BASED_OUTPATIENT_CLINIC_OR_DEPARTMENT_OTHER): Payer: PPO | Admitting: Physical Therapy

## 2022-03-19 ENCOUNTER — Encounter (HOSPITAL_BASED_OUTPATIENT_CLINIC_OR_DEPARTMENT_OTHER): Payer: Self-pay | Admitting: Physical Therapy

## 2022-03-19 DIAGNOSIS — M79604 Pain in right leg: Secondary | ICD-10-CM

## 2022-03-19 DIAGNOSIS — R262 Difficulty in walking, not elsewhere classified: Secondary | ICD-10-CM

## 2022-03-19 DIAGNOSIS — M6281 Muscle weakness (generalized): Secondary | ICD-10-CM

## 2022-03-19 NOTE — Therapy (Signed)
OUTPATIENT PHYSICAL THERAPY TREATMENT NOTE   Patient Name: Carmen Davies MRN: [id number] DOB:29-May-1951, 71 y.o., female Today's Date: 03/19/2022    END OF SESSION:   PT End of Session - 03/19/22 0955     Visit Number 5    Number of Visits 16    Date for PT Re-Evaluation 04/13/22    Authorization Type HTA    PT Start Time 0940    PT Stop Time 1020    PT Time Calculation (min) 40 min    Activity Tolerance Patient tolerated treatment well    Behavior During Therapy [facility] for tasks assessed/performed                Past Medical History:  Diagnosis Date   COVID-19 virus detected 10/20/2021   Diabetes mellitus without complication ([place])    Type II, controls with diet   Diverticulitis    Hyperlipidemia    Hypertension    Varicose veins    Past Surgical History:  Procedure Laterality Date   ABDOMINAL HYSTERECTOMY     TRANSFORAMINAL LUMBAR INTERBODY FUSION W/ MIS 1 LEVEL Left 11/13/2021   Procedure: Left Lumbar five-Sacral one Minimally Invasive Transforminal Lumbar Interbody Fusion;  Surgeon: Judith Part, MD;  Location: [place];  Service: Neurosurgery;  Laterality: Left;   Patient Active Problem List   Diagnosis Date Noted   Lumbar radiculopathy 11/13/2021   Preop cardiovascular exam 11/04/2021   LBBB (left bundle branch block) 11/04/2021   Murmur 11/04/2021   Controlled type 2 diabetes with neuropathy ([place]) 11/04/2021   Primary hypertension 11/04/2021   Mixed hyperlipidemia 11/04/2021   Varicose veins of leg with complications 95/18/8416   Varicose veins of lower extremities with other complications 60/63/0160   DM 06/28/2008   COLONIC POLYPS 03/20/2008   HYPERCHOLESTEROLEMIA 03/20/2008   ANXIETY 03/20/2008   HYPERTENSION 03/20/2008   ASTHMATIC BRONCHITIS, ACUTE 03/20/2008   ALLERGIC RHINITIS 03/20/2008   PEPTIC ULCER DISEASE 03/20/2008   DEGENERATIVE JOINT DISEASE 03/20/2008    PCP: Lajean Manes, MD   REFERRING PROVIDER: Judith Part, MD     REFERRING DIAG: M54.16 (ICD-10-CM) - Radiculopathy, lumbar region    Rationale for Evaluation and Treatment Rehabilitation   THERAPY DIAG:  Pain in right leg   Muscle weakness (generalized)   Difficulty in walking, not elsewhere classified   ONSET DATE: DOS 11/13/2021   SUBJECTIVE:                                                                                                                                                                                            SUBJECTIVE STATEMENT: Pt underwent Left L5-S1 minimally  invasive transforaminal lumbar interbody fusion on 11/13/2021.  Pt saw MD last month and pt states she was informed she could do what she felt comfortable.    Pt is 18 weeks s/p L5-S1 fusion.  Pt states therapy is helping.  She reports improved walking.  Pt denies any adverse effects after prior Rx.  Pt states she has been trying to perform her HEP.  She was at the beach last week.  Pt states she is improving.  "I'm doing good".  Pt reports improved ability to perform sit/stand transfers though continues to have difficulty.  Pt reports improved bed mobility.  Pt reports improved gait and is ambulating on TM for 1 hour Mon-Thurs.  Pt is limited with bending.  Pt is limited with performing household chores including cleaning and watering her flowers.  Pt states she did bending yesterday cleaning her baseboards and feels very stiff today.  Pt reports she gets stiff after sitting.  She has some pain with initially walking after sitting.  Pt states she typically feels better the more she does.      PERTINENT HISTORY:  L5-S1 fusion on 11/13/2021.  DM   PAIN:  Are you having pain? None at rest. Current:  0/10, Worst:  6-7/10 with bending.  3/10 pain with sit to stand transfers. Location:  R post hip and R posterior and lateral R thigh to her knee.     PRECAUTIONS: Other: Lumbar Fusion   WEIGHT BEARING RESTRICTIONS No   FALLS:  Has patient fallen in last 6 months? No      OCCUPATION: Pt is retired   PLOF: Independent     PATIENT GOALS Pt states she loves cleaning her house and wants to be able to clean her baseboards.  Improved ability to perform sit/stand transfers and perform car transfers.       OBJECTIVE:    DIAGNOSTIC FINDINGS:  Pt is post op   TODAY'S TREATMENT   Therapeutic exercise:  Reviewed current function, response to prior Rx, and pain levels.  Pt performed: Supine alt LE ext with TrA 2x10 Supine alt UE/LE with Tra approx 2x10 reps Supine bridge with TrA 3x10 Supine SLR with TrA 2x10 reps Supine manual HS stretch 2x30 sec Sit to stands without UE support from table with table elevated Heel raises with Tra x20 reps Step ups x 10 each LE with TrA SLS with TrA with light UE support 3x10 sec each leg NuStep at L2 with bilat UE/LE x 6 mins     PATIENT EDUCATION:  Education details: PT updated HEP and gave pt an updated HEP handout.  Educated pt in correct form and appropriate frequency. POC, dx, relevant anatomy. Person educated: Patient Education method: Explanation, Demonstration, Tactile cues, Verbal cues, and Handouts Education comprehension: verbalized understanding, returned demonstration, verbal cues required, tactile cues required, and needs further education     HOME EXERCISE PROGRAM: Access Code: [email or web address] URL: [email or web address] Date: 02/16/2022 Prepared by: Ronny Flurry   Exercises - Supine Transversus Abdominis Bracing - Hands on Stomach  - 2-3 x daily - 7 x weekly - 1-2 sets - 10 reps - 3-5 seconds hold - Supine March  - 1-2 x daily - 7 x weekly - 2 sets - 10 reps - Log Roll  - 1 x daily - 1 x weekly - 1 sets - 1 reps - Supine Transversus Abdominis Bracing with Leg Extension  - 1 x daily - 7 x weekly - 2 sets - 10 reps - Hooklying Clamshell with  Resistance  - 1 x daily - 4-5 x weekly - 2 sets - 10 reps - Standing Shoulder Row with Anchored Resistance  - 1 x daily - 4-5 x weekly - 2 sets - 10  reps Updated HEP: - Shoulder extension with resistance - Neutral  - 1 x daily - 4-5 x weekly - 2 sets - 10 reps - Supine Bridge  - 1 x daily - 5 x weekly - 2 sets - 10 reps   ASSESSMENT:   CLINICAL IMPRESSION: Pt is progressing well with function, strength, and pain.  She is improving with core strength and demonstrates improved form with core exercises.  She continues to require cuing for correct form with exercises though is improving.  PT progressed exercises per protocol and pt tolerated exercises well without c/o's.  Pt had minimal tightness in bilat HS though symmetrical.  Pt responded well to Rx reporting much improved stiffness and no pain after Rx.  Pt should benefit from continued skilled PT services to address impairments and to improve overall function.      OBJECTIVE IMPAIRMENTS Abnormal gait, decreased activity tolerance, decreased endurance, decreased mobility, difficulty walking, decreased strength, hypomobility, impaired flexibility, impaired sensation, and pain.    ACTIVITY LIMITATIONS carrying, lifting, bending, standing, squatting, transfers, bed mobility, and locomotion level   PARTICIPATION LIMITATIONS: meal prep, cleaning, laundry, shopping, community activity, and yard work   PERSONAL FACTORS 1 comorbidity: DM  are also affecting patient's functional outcome.    REHAB POTENTIAL: Good   CLINICAL DECISION MAKING: Stable/uncomplicated   EVALUATION COMPLEXITY: Low     GOALS:     SHORT TERM GOALS: Target date: 03/09/2022   Pt will be independent and compliant with HEP for improved pain, strength, and function.  Baseline: Goal status: INITIAL   2.  Pt will demo improved ease with sit to stand transfers and report reduced pain for improved mobility. Baseline:  Goal status: INITIAL   3.  Pt will demo correct form with log rolling for improved bed mobility. Baseline:  Goal status: INITIAL Goal Status:  03/02/2022   4.  Pt will demo improved core strength as  evidenced by progression of core exercises without adverse effects for improved performance of and tolerance with daily activities and functional mobility   Baseline:  Goal status: INITIAL       LONG TERM GOALS: Target date:  04/13/2022   Pt will ambulate with a normalized gait pattern with = Wb'ing without limping  Baseline:  Goal status: INITIAL   2.  Pt will report she is able to ambulate extended community distance without significant pain or difficulty.  Baseline:  Goal status: INITIAL   3.  Pt will be able to perform her normal daily transfers without difficulty.  Baseline:  Goal status: INITIAL   4.  Pt will be able to perform appropriate household chores without significant pain or difficulty.  Baseline:  Goal status: INITIAL   5.  Pt will demo improved R LE strength to = L LE for improved functional mobility. Baseline:  Goal status: INITIAL         PLAN: PT FREQUENCY: 2x/week   PT DURATION: 8 weeks   PLANNED INTERVENTIONS: Therapeutic exercises, Therapeutic activity, Neuromuscular re-education, Balance training, Gait training, Patient/Family education, Joint mobilization, Stair training, Aquatic Therapy, Dry Needling, Electrical stimulation, Cryotherapy, Moist heat, Taping, Manual therapy, and Re-evaluation.   PLAN FOR NEXT SESSION:  Progress core strengthening per lumbar fusion protocol.     Selinda Michaels III PT, DPT  03/19/22 12:40 PM

## 2022-03-23 NOTE — Therapy (Signed)
OUTPATIENT PHYSICAL THERAPY TREATMENT NOTE   Patient Name: Carmen Davies MRN: [id number] DOB:[birth year], 71 y.o., female Today's Date: 03/23/2022    END OF SESSION:        Past Medical History:  Diagnosis Date   COVID-19 virus detected 10/20/2021   Diabetes mellitus without complication ([place])    Type II, controls with diet   Diverticulitis    Hyperlipidemia    Hypertension    Varicose veins    Past Surgical History:  Procedure Laterality Date   ABDOMINAL HYSTERECTOMY     TRANSFORAMINAL LUMBAR INTERBODY FUSION W/ MIS 1 LEVEL Left 11/13/2021   Procedure: Left Lumbar five-Sacral one Minimally Invasive Transforminal Lumbar Interbody Fusion;  Surgeon: Judith Part, MD;  Location: [facility];  Service: Neurosurgery;  Laterality: Left;   Patient Active Problem List   Diagnosis Date Noted   Lumbar radiculopathy 11/13/2021   Preop cardiovascular exam 11/04/2021   LBBB (left bundle branch block) 11/04/2021   Murmur 11/04/2021   Controlled type 2 diabetes with neuropathy ([place]) 11/04/2021   Primary hypertension 11/04/2021   Mixed hyperlipidemia 11/04/2021   Varicose veins of leg with complications 35/57/3220   Varicose veins of lower extremities with other complications 25/42/7062   DM 06/28/2008   COLONIC POLYPS 03/20/2008   HYPERCHOLESTEROLEMIA 03/20/2008   ANXIETY 03/20/2008   HYPERTENSION 03/20/2008   ASTHMATIC BRONCHITIS, ACUTE 03/20/2008   ALLERGIC RHINITIS 03/20/2008   PEPTIC ULCER DISEASE 03/20/2008   DEGENERATIVE JOINT DISEASE 03/20/2008    PCP: Lajean Manes, MD   REFERRING PROVIDER: Judith Part, MD    REFERRING DIAG: M54.16 (ICD-10-CM) - Radiculopathy, lumbar region    Rationale for Evaluation and Treatment Rehabilitation   THERAPY DIAG:  Pain in right leg   Muscle weakness (generalized)   Difficulty in walking, not elsewhere classified   ONSET DATE: DOS 11/13/2021   SUBJECTIVE:                                                                                                                                                                                             SUBJECTIVE STATEMENT: Pt underwent Left L5-S1 minimally invasive transforaminal lumbar interbody fusion on 11/13/2021.  Pt saw MD last month and pt states she was informed she could do what she felt comfortable.    Pt is 18 weeks and 5 days s/p L5-S1 fusion.  Pt states therapy is helping.  She reports improved walking.  Pt denies any adverse effects after prior Rx.  Pt states she has been trying to perform her HEP.  She was at the beach last week.  Pt states she is improving.  "I'm doing  good".  Pt reports improved ability to perform sit/stand transfers though continues to have difficulty.  Pt reports improved bed mobility.  Pt reports improved gait and is ambulating on TM for 1 hour Mon-Thurs.  Pt is limited with bending.  Pt is limited with performing household chores including cleaning and watering her flowers.  Pt states she did bending yesterday cleaning her baseboards and feels very stiff today.  Pt reports she gets stiff after sitting.  She has some pain with initially walking after sitting.  Pt states she typically feels better the more she does.      PERTINENT HISTORY:  L5-S1 fusion on 11/13/2021.  DM   PAIN:  Are you having pain? None at rest. Current:  0/10, Worst:  6-7/10 with bending.  3/10 pain with sit to stand transfers. Location:  R post hip and R posterior and lateral R thigh to her knee.     PRECAUTIONS: Other: Lumbar Fusion   WEIGHT BEARING RESTRICTIONS No   FALLS:  Has patient fallen in last 6 months? No     OCCUPATION: Pt is retired   PLOF: Independent     PATIENT GOALS Pt states she loves cleaning her house and wants to be able to clean her baseboards.  Improved ability to perform sit/stand transfers and perform car transfers.       OBJECTIVE:    DIAGNOSTIC FINDINGS:  Pt is post op   TODAY'S TREATMENT   Therapeutic exercise:   Reviewed current function, response to prior Rx, and pain levels.  Pt performed: Supine alt LE ext with TrA 2x10 Supine alt UE/LE with Tra approx 2x10 reps Supine bridge with TrA 3x10 Supine SLR with TrA 2x10 reps Supine manual HS stretch 2x30 sec Sit to stands without UE support from table with table elevated Heel raises with Tra x20 reps Step ups x 10 each LE with TrA SLS with TrA with light UE support 3x10 sec each leg NuStep at L2 with bilat UE/LE x 6 mins     PATIENT EDUCATION:  Education details: PT updated HEP and gave pt an updated HEP handout.  Educated pt in correct form and appropriate frequency. POC, dx, relevant anatomy. Person educated: Patient Education method: Explanation, Demonstration, Tactile cues, Verbal cues, and Handouts Education comprehension: verbalized understanding, returned demonstration, verbal cues required, tactile cues required, and needs further education     HOME EXERCISE PROGRAM: Access Code: [email or web address] URL: [email or web address] Date: 02/16/2022 Prepared by: Ronny Flurry   Exercises - Supine Transversus Abdominis Bracing - Hands on Stomach  - 2-3 x daily - 7 x weekly - 1-2 sets - 10 reps - 3-5 seconds hold - Supine March  - 1-2 x daily - 7 x weekly - 2 sets - 10 reps - Log Roll  - 1 x daily - 1 x weekly - 1 sets - 1 reps - Supine Transversus Abdominis Bracing with Leg Extension  - 1 x daily - 7 x weekly - 2 sets - 10 reps - Hooklying Clamshell with Resistance  - 1 x daily - 4-5 x weekly - 2 sets - 10 reps - Standing Shoulder Row with Anchored Resistance  - 1 x daily - 4-5 x weekly - 2 sets - 10 reps Updated HEP: - Shoulder extension with resistance - Neutral  - 1 x daily - 4-5 x weekly - 2 sets - 10 reps - Supine Bridge  - 1 x daily - 5 x weekly - 2 sets - 10 reps   ASSESSMENT:  CLINICAL IMPRESSION: Pt is progressing well with function, strength, and pain.  She is improving with core strength and demonstrates improved form  with core exercises.  She continues to require cuing for correct form with exercises though is improving.  PT progressed exercises per protocol and pt tolerated exercises well without c/o's.  Pt had minimal tightness in bilat HS though symmetrical.  Pt responded well to Rx reporting much improved stiffness and no pain after Rx.  Pt should benefit from continued skilled PT services to address impairments and to improve overall function.      OBJECTIVE IMPAIRMENTS Abnormal gait, decreased activity tolerance, decreased endurance, decreased mobility, difficulty walking, decreased strength, hypomobility, impaired flexibility, impaired sensation, and pain.    ACTIVITY LIMITATIONS carrying, lifting, bending, standing, squatting, transfers, bed mobility, and locomotion level   PARTICIPATION LIMITATIONS: meal prep, cleaning, laundry, shopping, community activity, and yard work   PERSONAL FACTORS 1 comorbidity: DM  are also affecting patient's functional outcome.    REHAB POTENTIAL: Good   CLINICAL DECISION MAKING: Stable/uncomplicated   EVALUATION COMPLEXITY: Low     GOALS:     SHORT TERM GOALS: Target date: 03/09/2022   Pt will be independent and compliant with HEP for improved pain, strength, and function.  Baseline: Goal status: INITIAL   2.  Pt will demo improved ease with sit to stand transfers and report reduced pain for improved mobility. Baseline:  Goal status: INITIAL   3.  Pt will demo correct form with log rolling for improved bed mobility. Baseline:  Goal status: INITIAL Goal Status:  03/02/2022   4.  Pt will demo improved core strength as evidenced by progression of core exercises without adverse effects for improved performance of and tolerance with daily activities and functional mobility   Baseline:  Goal status: INITIAL       LONG TERM GOALS: Target date:  04/13/2022   Pt will ambulate with a normalized gait pattern with = Wb'ing without limping  Baseline:  Goal  status: INITIAL   2.  Pt will report she is able to ambulate extended community distance without significant pain or difficulty.  Baseline:  Goal status: INITIAL   3.  Pt will be able to perform her normal daily transfers without difficulty.  Baseline:  Goal status: INITIAL   4.  Pt will be able to perform appropriate household chores without significant pain or difficulty.  Baseline:  Goal status: INITIAL   5.  Pt will demo improved R LE strength to = L LE for improved functional mobility. Baseline:  Goal status: INITIAL         PLAN: PT FREQUENCY: 2x/week   PT DURATION: 8 weeks   PLANNED INTERVENTIONS: Therapeutic exercises, Therapeutic activity, Neuromuscular re-education, Balance training, Gait training, Patient/Family education, Joint mobilization, Stair training, Aquatic Therapy, Dry Needling, Electrical stimulation, Cryotherapy, Moist heat, Taping, Manual therapy, and Re-evaluation.   PLAN FOR NEXT SESSION:  Progress core strengthening per lumbar fusion protocol.     Selinda Michaels III PT, DPT 03/23/22 11:22 PM

## 2022-03-24 ENCOUNTER — Ambulatory Visit (HOSPITAL_BASED_OUTPATIENT_CLINIC_OR_DEPARTMENT_OTHER): Payer: PPO | Admitting: Physical Therapy

## 2022-03-24 ENCOUNTER — Encounter (HOSPITAL_BASED_OUTPATIENT_CLINIC_OR_DEPARTMENT_OTHER): Payer: Self-pay | Admitting: Physical Therapy

## 2022-03-24 DIAGNOSIS — M79604 Pain in right leg: Secondary | ICD-10-CM

## 2022-03-24 DIAGNOSIS — R262 Difficulty in walking, not elsewhere classified: Secondary | ICD-10-CM

## 2022-03-24 DIAGNOSIS — M6281 Muscle weakness (generalized): Secondary | ICD-10-CM

## 2022-03-26 ENCOUNTER — Ambulatory Visit (HOSPITAL_BASED_OUTPATIENT_CLINIC_OR_DEPARTMENT_OTHER): Payer: PPO | Admitting: Physical Therapy

## 2022-03-26 ENCOUNTER — Encounter (HOSPITAL_BASED_OUTPATIENT_CLINIC_OR_DEPARTMENT_OTHER): Payer: Self-pay | Admitting: Physical Therapy

## 2022-03-26 DIAGNOSIS — R262 Difficulty in walking, not elsewhere classified: Secondary | ICD-10-CM

## 2022-03-26 DIAGNOSIS — M79604 Pain in right leg: Secondary | ICD-10-CM

## 2022-03-26 DIAGNOSIS — M6281 Muscle weakness (generalized): Secondary | ICD-10-CM

## 2022-03-26 NOTE — Therapy (Signed)
OUTPATIENT PHYSICAL THERAPY TREATMENT NOTE   Patient Name: Carmen Davies MRN: [id number] DOB:1951-04-05, 71 y.o., female Today's Date: 03/26/2022    END OF SESSION:   PT End of Session - 03/26/22 0940     Visit Number 7    Number of Visits 16    Date for PT Re-Evaluation 04/13/22    Authorization Type HTA    PT Start Time 0932    PT Stop Time 6945    PT Time Calculation (min) 42 min    Activity Tolerance Patient tolerated treatment well    Behavior During Therapy [facility] for tasks assessed/performed                 Past Medical History:  Diagnosis Date   COVID-19 virus detected 10/20/2021   Diabetes mellitus without complication ([place])    Type II, controls with diet   Diverticulitis    Hyperlipidemia    Hypertension    Varicose veins    Past Surgical History:  Procedure Laterality Date   ABDOMINAL HYSTERECTOMY     TRANSFORAMINAL LUMBAR INTERBODY FUSION W/ MIS 1 LEVEL Left 11/13/2021   Procedure: Left Lumbar five-Sacral one Minimally Invasive Transforminal Lumbar Interbody Fusion;  Surgeon: Judith Part, MD;  Location: [place];  Service: Neurosurgery;  Laterality: Left;   Patient Active Problem List   Diagnosis Date Noted   Lumbar radiculopathy 11/13/2021   Preop cardiovascular exam 11/04/2021   LBBB (left bundle branch block) 11/04/2021   Murmur 11/04/2021   Controlled type 2 diabetes with neuropathy ([place]) 11/04/2021   Primary hypertension 11/04/2021   Mixed hyperlipidemia 11/04/2021   Varicose veins of leg with complications 03/88/8280   Varicose veins of lower extremities with other complications 03/49/1791   DM 06/28/2008   COLONIC POLYPS 03/20/2008   HYPERCHOLESTEROLEMIA 03/20/2008   ANXIETY 03/20/2008   HYPERTENSION 03/20/2008   ASTHMATIC BRONCHITIS, ACUTE 03/20/2008   ALLERGIC RHINITIS 03/20/2008   PEPTIC ULCER DISEASE 03/20/2008   DEGENERATIVE JOINT DISEASE 03/20/2008    PCP: Lajean Manes, MD   REFERRING PROVIDER: Judith Part,  MD    REFERRING DIAG: M54.16 (ICD-10-CM) - Radiculopathy, lumbar region    Rationale for Evaluation and Treatment Rehabilitation   THERAPY DIAG:  Pain in right leg   Muscle weakness (generalized)   Difficulty in walking, not elsewhere classified   ONSET DATE: DOS 11/13/2021   SUBJECTIVE:                                                                                                                                                                                            SUBJECTIVE STATEMENT: Pt underwent Left L5-S1  minimally invasive transforaminal lumbar interbody fusion on 11/13/2021.  Pt saw MD last month and pt states she was informed she could do what she felt comfortable.    Pt is 19 weeks s/p L5-S1 fusion.  Pt states she feels a lot better since she started and also feels better this week.  Pt states she is improving though still has difficulty walking.  She is limited with ambulation distance.  Pt states she still has R LE pain with standing up after sitting 3-5 mins though is improving.  Pt wears a brace a lot at home which helps.  MD stated that was fine to use it.  Pt reports compliance with HEP.  Pt denies any adverse effects after prior Rx.  Pr reports compliance with HEP.   Pt states she is a little tired and has been doing more.  Pt walked 45 mins on TM and is able to ambulate 1 hour on TM easy. Pt has been dusting and watering flowers.  Pt is able to water flowers without pain.        PERTINENT HISTORY:  L5-S1 fusion on 11/13/2021.  DM   PAIN:  Are you having pain? "I'm not in any pain at all" Current:  0/10, Worst:  6-7/10 with bending.  3/10 pain with sit to stand transfers. Location:  R post hip and R posterior and lateral R thigh to her knee.     PRECAUTIONS: Other: Lumbar Fusion   WEIGHT BEARING RESTRICTIONS No   FALLS:  Has patient fallen in last 6 months? No     OCCUPATION: Pt is retired   PLOF: Independent     PATIENT GOALS Pt states she loves  cleaning her house and wants to be able to clean her baseboards.  Improved ability to perform sit/stand transfers and perform car transfers.       OBJECTIVE:    DIAGNOSTIC FINDINGS:  Pt is post op   TODAY'S TREATMENT   Therapeutic exercise:  Reviewed current function, response to prior Rx, and pain levels.  Pt performed: NuStep at L2-3 with bilat UE/LE x 5 mins Supine bridge with TrA x20 reps Supine SLR with Tra x 10 reps and with contralateral UE extension x 10 reps SLS with TrA with light UE support 3x20 sec each leg Q-ped alt UE raise with TrA 2 x 10 reps Q-ped alt LE ext with TrA 2 x 10 reps  Therapeutic Activities:  (to improve functional mobility and functional strength) Marching on airex with TrA x 20 reps with UE support Mini squats with UE support 2x10 reps Step ups 2 x 10 each LE with TrA     PATIENT EDUCATION:  Education details: PT updated HEP and gave pt an updated HEP handout.  Educated pt in correct form and appropriate frequency. POC, dx, relevant anatomy. Person educated: Patient Education method: Explanation, Demonstration, Tactile cues, Verbal cues, and Handouts Education comprehension: verbalized understanding, returned demonstration, verbal cues required, tactile cues required, and needs further education     HOME EXERCISE PROGRAM: Access Code: [email or web address] URL: [email or web address] Date: 02/16/2022 Prepared by: Ronny Flurry   Exercises - Supine Transversus Abdominis Bracing - Hands on Stomach  - 2-3 x daily - 7 x weekly - 1-2 sets - 10 reps - 3-5 seconds hold - Supine March  - 1-2 x daily - 7 x weekly - 2 sets - 10 reps - Log Roll  - 1 x daily - 1 x weekly - 1 sets - 1 reps - Supine Transversus  Abdominis Bracing with Leg Extension  - 1 x daily - 7 x weekly - 2 sets - 10 reps - Hooklying Clamshell with Resistance  - 1 x daily - 4-5 x weekly - 2 sets - 10 reps - Standing Shoulder Row with Anchored Resistance  - 1 x daily - 4-5 x weekly - 2  sets - 10 reps Updated HEP: - Shoulder extension with resistance - Neutral  - 1 x daily - 4-5 x weekly - 2 sets - 10 reps - Supine Bridge  - 1 x daily - 5 x weekly - 2 sets - 10 reps   ASSESSMENT:   CLINICAL IMPRESSION: Pt is improving with function as evidenced by subjective reports.  She reports she is able to water her flowers without difficulty or pain.  Pt is improving with core strength as evidenced by progressing with core exercises per protocol.  Pt requires much cuing and instruction in correct form with exercises especially mini squats.  She demonstrated improved form with instruction and increased repetitions.  She responded well to Rx reporting no pain after Rx.  Pt should benefit from continued skilled PT services to address impairments and to improve overall function.      OBJECTIVE IMPAIRMENTS Abnormal gait, decreased activity tolerance, decreased endurance, decreased mobility, difficulty walking, decreased strength, hypomobility, impaired flexibility, impaired sensation, and pain.    ACTIVITY LIMITATIONS carrying, lifting, bending, standing, squatting, transfers, bed mobility, and locomotion level   PARTICIPATION LIMITATIONS: meal prep, cleaning, laundry, shopping, community activity, and yard work   PERSONAL FACTORS 1 comorbidity: DM  are also affecting patient's functional outcome.    REHAB POTENTIAL: Good   CLINICAL DECISION MAKING: Stable/uncomplicated   EVALUATION COMPLEXITY: Low     GOALS:     SHORT TERM GOALS: Target date: 03/09/2022   Pt will be independent and compliant with HEP for improved pain, strength, and function.  Baseline: Goal status: INITIAL   2.  Pt will demo improved ease with sit to stand transfers and report reduced pain for improved mobility. Baseline:  Goal status: INITIAL   3.  Pt will demo correct form with log rolling for improved bed mobility. Baseline:  Goal status: INITIAL Goal Status:  03/02/2022   4.  Pt will demo improved  core strength as evidenced by progression of core exercises without adverse effects for improved performance of and tolerance with daily activities and functional mobility   Baseline:  Goal status: INITIAL       LONG TERM GOALS: Target date:  04/13/2022   Pt will ambulate with a normalized gait pattern with = Wb'ing without limping  Baseline:  Goal status: INITIAL   2.  Pt will report she is able to ambulate extended community distance without significant pain or difficulty.  Baseline:  Goal status: INITIAL   3.  Pt will be able to perform her normal daily transfers without difficulty.  Baseline:  Goal status: INITIAL   4.  Pt will be able to perform appropriate household chores without significant pain or difficulty.  Baseline:  Goal status: INITIAL   5.  Pt will demo improved R LE strength to = L LE for improved functional mobility. Baseline:  Goal status: INITIAL         PLAN: PT FREQUENCY: 2x/week   PT DURATION: 8 weeks   PLANNED INTERVENTIONS: Therapeutic exercises, Therapeutic activity, Neuromuscular re-education, Balance training, Gait training, Patient/Family education, Joint mobilization, Stair training, Aquatic Therapy, Dry Needling, Electrical stimulation, Cryotherapy, Moist heat, Taping, Manual therapy,  and Re-evaluation.   PLAN FOR NEXT SESSION:  Progress core strengthening per lumbar fusion protocol.     Selinda Michaels III PT, DPT 03/26/22 11:05 PM

## 2022-03-31 ENCOUNTER — Encounter (HOSPITAL_BASED_OUTPATIENT_CLINIC_OR_DEPARTMENT_OTHER): Payer: PPO | Admitting: Physical Therapy

## 2022-04-02 ENCOUNTER — Encounter (HOSPITAL_BASED_OUTPATIENT_CLINIC_OR_DEPARTMENT_OTHER): Payer: PPO | Admitting: Physical Therapy

## 2022-04-07 ENCOUNTER — Ambulatory Visit (HOSPITAL_BASED_OUTPATIENT_CLINIC_OR_DEPARTMENT_OTHER): Payer: PPO | Admitting: Physical Therapy

## 2022-04-09 ENCOUNTER — Ambulatory Visit (HOSPITAL_BASED_OUTPATIENT_CLINIC_OR_DEPARTMENT_OTHER): Payer: PPO | Admitting: Physical Therapy

## 2022-04-14 ENCOUNTER — Ambulatory Visit (HOSPITAL_BASED_OUTPATIENT_CLINIC_OR_DEPARTMENT_OTHER): Payer: PPO | Attending: Neurological Surgery | Admitting: Physical Therapy

## 2022-04-14 DIAGNOSIS — R262 Difficulty in walking, not elsewhere classified: Secondary | ICD-10-CM | POA: Insufficient documentation

## 2022-04-14 DIAGNOSIS — M6281 Muscle weakness (generalized): Secondary | ICD-10-CM | POA: Insufficient documentation

## 2022-04-14 DIAGNOSIS — M79604 Pain in right leg: Secondary | ICD-10-CM | POA: Insufficient documentation

## 2022-04-14 NOTE — Therapy (Incomplete)
OUTPATIENT PHYSICAL THERAPY TREATMENT NOTE   Patient Name: Carmen Davies MRN: [id number] DOB:[birth year], 71 y.o., female Today's Date: 04/15/2022    END OF SESSION:   PT End of Session - 04/14/22 1438     Visit Number 8    Number of Visits 16    Date for PT Re-Evaluation 05/12/22    Authorization Type HTA    PT Start Time 1355    PT Stop Time 1437    PT Time Calculation (min) 42 min    Activity Tolerance Patient tolerated treatment well    Behavior During Therapy [facility] for tasks assessed/performed                  Past Medical History:  Diagnosis Date   COVID-19 virus detected 10/20/2021   Diabetes mellitus without complication ([place])    Type II, controls with diet   Diverticulitis    Hyperlipidemia    Hypertension    Varicose veins    Past Surgical History:  Procedure Laterality Date   ABDOMINAL HYSTERECTOMY     TRANSFORAMINAL LUMBAR INTERBODY FUSION W/ MIS 1 LEVEL Left 11/13/2021   Procedure: Left Lumbar five-Sacral one Minimally Invasive Transforminal Lumbar Interbody Fusion;  Surgeon: Judith Part, MD;  Location: [place];  Service: Neurosurgery;  Laterality: Left;   Patient Active Problem List   Diagnosis Date Noted   Lumbar radiculopathy 11/13/2021   Preop cardiovascular exam 11/04/2021   LBBB (left bundle branch block) 11/04/2021   Murmur 11/04/2021   Controlled type 2 diabetes with neuropathy ([place]) 11/04/2021   Primary hypertension 11/04/2021   Mixed hyperlipidemia 11/04/2021   Varicose veins of leg with complications 75/12/1831   Varicose veins of lower extremities with other complications 58/25/1898   DM 06/28/2008   COLONIC POLYPS 03/20/2008   HYPERCHOLESTEROLEMIA 03/20/2008   ANXIETY 03/20/2008   HYPERTENSION 03/20/2008   ASTHMATIC BRONCHITIS, ACUTE 03/20/2008   ALLERGIC RHINITIS 03/20/2008   PEPTIC ULCER DISEASE 03/20/2008   DEGENERATIVE JOINT DISEASE 03/20/2008    PCP: Lajean Manes, MD   REFERRING PROVIDER: Judith Part,  MD    REFERRING DIAG: M54.16 (ICD-10-CM) - Radiculopathy, lumbar region    Rationale for Evaluation and Treatment Rehabilitation   THERAPY DIAG:  Pain in right leg   Muscle weakness (generalized)   Difficulty in walking, not elsewhere classified   ONSET DATE: DOS 11/13/2021   SUBJECTIVE:                                                                                                                                                                                            SUBJECTIVE STATEMENT: Pt underwent Left  L5-S1 minimally invasive transforaminal lumbar interbody fusion on 11/13/2021.  Pt states MD informed her she could do what she felt comfortable.    Pt is nearly 5 months s/p L5-S1 fusion.  Pt wears a brace a lot at home which helps.  MD stated that was fine to use it.  Pt reports compliance with HEP.  Pt denies any adverse effects after prior Rx.  Pr reports compliance with HEP.   Pt walks 1 hour daily on TM and 4 days/wk in the gym for 45 mins - 1 hour on TM.  Pt denies pain currently and states she has had no pain recently.     FUNCTIONAL IMPROVEMENTS:  sit/stand transfers, gait, initiating ambulation.  Pt is able to water flowers without pain.  Pt performs transfers without difficulty.   FUNCTIONAL LIMITATIONS:  limited in household chores.  Pt states her bed is really high and she does have difficulty getting into her bed.   PERTINENT HISTORY:  L5-S1 fusion on 11/13/2021.  DM   PAIN:  Are you having pain? No Current:  0/10, Worst: 0/10       PRECAUTIONS: Other: Lumbar Fusion   WEIGHT BEARING RESTRICTIONS No   FALLS:  Has patient fallen in last 6 months? No     OCCUPATION: Pt is retired   PLOF: Independent     PATIENT GOALS Pt states she loves cleaning her house and wants to be able to clean her baseboards.  Improved ability to perform sit/stand transfers and perform car transfers.       OBJECTIVE:    DIAGNOSTIC FINDINGS:  Pt is post op   TODAY'S  TREATMENT   PHYSICAL PERFORMANCE TESTING:  PATIENT SURVEYS:  Pt completed FOTO: Current/Initial:  60/46 with a goal of 62 at visit #11.  5x STS Test:  Initial/Current:  21 seconds with bilat UE support / 12 seconds without UE support  LOWER EXTREMITY STRENGTH:      MMT Right  Left   Hip flexion 4/5 5/5  Hip extension      Hip abduction      Hip adduction      Hip internal rotation      Hip external rotation 4+/5 4+/5  Knee flexion 5/5 tested in sitting 5/5 tested in sitting  Knee extension 5/5 5/5  Ankle dorsiflexion      Ankle plantarflexion      Ankle inversion      Ankle eversion       (Blank rows = not tested)       GAIT: Assistive device utilized: None Level of assistance: Complete Independence Comments:  Pt has improved speed and quality of gait.  She has improved step length bilat and improved pelvic rotation. Pt still favors R LE and shifts to R with gait     Therapeutic exercise:  Pt performed: Supine bridge with TrA x20 reps Supine SLR with Tra x 10 reps and with contralateral UE extension approx 15 reps Step ups 2 x 10 each LE with TrA     PATIENT EDUCATION:  Education details: POC, objective findings, exercise form, relevant anatomy.   Person educated: Patient Education method: Explanation, Demonstration, Tactile cues, Verbal cues, and Handouts Education comprehension: verbalized understanding, returned demonstration, verbal cues required, tactile cues required, and needs further education     HOME EXERCISE PROGRAM: Access Code: [email or web address] URL: [email or web address] Date: 02/16/2022 Prepared by: Ronny Flurry   Exercises - Supine Transversus Abdominis Bracing - Hands on Stomach  - 2-3 x daily -  7 x weekly - 1-2 sets - 10 reps - 3-5 seconds hold - Supine March  - 1-2 x daily - 7 x weekly - 2 sets - 10 reps - Log Roll  - 1 x daily - 1 x weekly - 1 sets - 1 reps - Supine Transversus Abdominis Bracing with Leg Extension  - 1 x daily -  7 x weekly - 2 sets - 10 reps - Hooklying Clamshell with Resistance  - 1 x daily - 4-5 x weekly - 2 sets - 10 reps - Standing Shoulder Row with Anchored Resistance  - 1 x daily - 4-5 x weekly - 2 sets - 10 reps Updated HEP: - Shoulder extension with resistance - Neutral  - 1 x daily - 4-5 x weekly - 2 sets - 10 reps - Supine Bridge  - 1 x daily - 5 x weekly - 2 sets - 10 reps   ASSESSMENT:   CLINICAL IMPRESSION: Pt is progressing well with strength, functional mobility, and tolerance to activity.  Pt demonstrates improved ease of transfers and improved functional LE strength as evidenced by performance of 5x STS test.  She improved with 5x STS test from initially 21 seconds with bilat UE support to currently 12 seconds without UE support.  Pt is able to water her flowers without pain.  She is limited with household chores.  Pt has regularly been performing a walking program without adverse effects.  Pt demonstrates improved R hip ER and R knee strength.  Pt continues to have R hip flexion weakness.  Pt also has minimal weakness in bilat hip ER though is symmetrical.  Pt still favors R LE with gait though has improved quality of gait.  Pt demonstrates improved self perceived disability with FOTO score improving from 46 initially to 60 currently.  Pt has met all of her STG's and LTG's #2,3.  Pt should benefit from cont skilled PT services to address ongoing goals and to restore desired level of function.       OBJECTIVE IMPAIRMENTS Abnormal gait, decreased activity tolerance, decreased endurance, decreased mobility, difficulty walking, decreased strength, hypomobility, impaired flexibility, impaired sensation, and pain.    ACTIVITY LIMITATIONS carrying, lifting, bending, standing, squatting, transfers, bed mobility, and locomotion level   PARTICIPATION LIMITATIONS: meal prep, cleaning, laundry, shopping, community activity, and yard work   PERSONAL FACTORS 1 comorbidity: DM  are also affecting  patient's functional outcome.    REHAB POTENTIAL: Good   CLINICAL DECISION MAKING: Stable/uncomplicated   EVALUATION COMPLEXITY: Low     GOALS:     SHORT TERM GOALS: Target date: 03/09/2022   Pt will be independent and compliant with HEP for improved pain, strength, and function.  Baseline: Goal status: GOAL MET   2.  Pt will demo improved ease with sit to stand transfers and report reduced pain for improved mobility. Baseline:  Goal status: GOAL MET   3.  Pt will demo correct form with log rolling for improved bed mobility. Baseline:  Goal status: GOAL MET Goal Status:  03/02/2022   4.  Pt will demo improved core strength as evidenced by progression of core exercises without adverse effects for improved performance of and tolerance with daily activities and functional mobility   Baseline:  Goal status: GOAL MET       LONG TERM GOALS: Target date:  05/12/2022   Pt will ambulate with a normalized gait pattern with = Wb'ing without limping  Baseline:  Goal status: PROGRESSING   2.  Pt will report she is able to ambulate extended community distance without significant pain or difficulty.  Baseline:  Goal status: GOAL MET   3.  Pt will be able to perform her normal daily transfers without difficulty.  Baseline:  Goal status: GOAL MET   4.  Pt will be able to perform appropriate household chores without significant pain or difficulty.  Baseline:  Goal status: PROGRESSING   5.  Pt will demo improved R LE strength to = L LE for improved functional mobility. Baseline:  Goal status:  PROGRESSING         PLAN: PT FREQUENCY: 2x/week   PT DURATION: 3-4 weeks   PLANNED INTERVENTIONS: Therapeutic exercises, Therapeutic activity, Neuromuscular re-education, Balance training, Gait training, Patient/Family education, Joint mobilization, Stair training, Aquatic Therapy, Dry Needling, Electrical stimulation, Cryotherapy, Moist heat, Taping, Manual therapy, and Re-evaluation.    PLAN FOR NEXT SESSION:  Progress core strengthening per lumbar fusion protocol.     Selinda Michaels III PT, DPT 04/15/22 7:01 PM

## 2022-04-15 ENCOUNTER — Encounter (HOSPITAL_BASED_OUTPATIENT_CLINIC_OR_DEPARTMENT_OTHER): Payer: Self-pay | Admitting: Physical Therapy

## 2022-04-20 NOTE — Therapy (Signed)
OUTPATIENT PHYSICAL THERAPY TREATMENT NOTE   Patient Name: Carmen Davies MRN: [id number] DOB:[birth year], 71 y.o., female Today's Date: 04/22/2022    END OF SESSION:   PT End of Session - 04/21/22 0820     Visit Number 9    Number of Visits 16    Date for PT Re-Evaluation 05/12/22    Authorization Type HTA    PT Start Time 0804    PT Stop Time 0849    PT Time Calculation (min) 45 min    Activity Tolerance Patient tolerated treatment well    Behavior During Therapy [facility] for tasks assessed/performed                   Past Medical History:  Diagnosis Date   COVID-19 virus detected 10/20/2021   Diabetes mellitus without complication ([place])    Type II, controls with diet   Diverticulitis    Hyperlipidemia    Hypertension    Varicose veins    Past Surgical History:  Procedure Laterality Date   ABDOMINAL HYSTERECTOMY     TRANSFORAMINAL LUMBAR INTERBODY FUSION W/ MIS 1 LEVEL Left 11/13/2021   Procedure: Left Lumbar five-Sacral one Minimally Invasive Transforminal Lumbar Interbody Fusion;  Surgeon: Judith Part, MD;  Location: [place];  Service: Neurosurgery;  Laterality: Left;   Patient Active Problem List   Diagnosis Date Noted   Lumbar radiculopathy 11/13/2021   Preop cardiovascular exam 11/04/2021   LBBB (left bundle branch block) 11/04/2021   Murmur 11/04/2021   Controlled type 2 diabetes with neuropathy ([place]) 11/04/2021   Primary hypertension 11/04/2021   Mixed hyperlipidemia 11/04/2021   Varicose veins of leg with complications 76/72/0947   Varicose veins of lower extremities with other complications 09/62/8366   DM 06/28/2008   COLONIC POLYPS 03/20/2008   HYPERCHOLESTEROLEMIA 03/20/2008   ANXIETY 03/20/2008   HYPERTENSION 03/20/2008   ASTHMATIC BRONCHITIS, ACUTE 03/20/2008   ALLERGIC RHINITIS 03/20/2008   PEPTIC ULCER DISEASE 03/20/2008   DEGENERATIVE JOINT DISEASE 03/20/2008    PCP: Lajean Manes, MD   REFERRING PROVIDER: Judith Part, MD    REFERRING DIAG: M54.16 (ICD-10-CM) - Radiculopathy, lumbar region    Rationale for Evaluation and Treatment Rehabilitation   THERAPY DIAG:  Pain in right leg   Muscle weakness (generalized)   Difficulty in walking, not elsewhere classified   ONSET DATE: DOS 11/13/2021   SUBJECTIVE:                                                                                                                                                                                            SUBJECTIVE STATEMENT: Pt underwent  Left L5-S1 minimally invasive transforaminal lumbar interbody fusion on 11/13/2021.  Pt states MD informed her she could do what she felt comfortable.    Pt is 5 months s/p L5-S1 fusion.  Pt wears a brace a lot at home which helps.  Pt is wearing lumbar brace today.  Pt states it reminds her not to bend as much.  Pt reports compliance with HEP.  Pt denies any adverse effects after prior Rx.  Pt walks 1 hour daily on TM and 4 days/wk in the gym for 45 mins - 1 hour on TM.  Pt denies pain currently and states she has had no pain recently.     FUNCTIONAL IMPROVEMENTS:  sit/stand transfers, gait, initiating ambulation.  Pt is able to water flowers without pain.  Pt performs transfers without difficulty.   FUNCTIONAL LIMITATIONS:  limited in household chores.  Pt states her bed is really high and she does have difficulty getting into her bed.   PERTINENT HISTORY:  L5-S1 fusion on 11/13/2021.  DM   PAIN:  Are you having pain? No Current:  0/10, Worst: 0/10       PRECAUTIONS: Other: Lumbar Fusion   WEIGHT BEARING RESTRICTIONS No   FALLS:  Has patient fallen in last 6 months? No     OCCUPATION: Pt is retired   PLOF: Independent     PATIENT GOALS Pt states she loves cleaning her house and wants to be able to clean her baseboards.  Improved ability to perform sit/stand transfers and perform car transfers.       OBJECTIVE:    DIAGNOSTIC FINDINGS:  Pt is post op   TODAY'S  TREATMENT   Therapeutic exercise:  Reviewed current function, response to prior Rx, and pain levels.  Pt performed: NuStep at L3 with bilat UE/LE x 5 mins Supine bridge with TrA x20 reps   Supine SLR with Tra and with contralateral UE extension 2 x 10 reps S/L clams with RTB 2x10 reps bilat Q-ped alt UE raise with TrA 2 x 10 reps Q-ped alt LE ext with TrA 2 x 10 reps   Therapeutic Activities:  (to improve functional mobility and functional strength) Step ups 2 x 10 each LE with TrA on 6 inch step Hip hiking 2x10 reps on floor  SLS with TrA with light UE support 3x20 sec each leg     PATIENT EDUCATION:  Education details: POC, exercise form, relevant anatomy.  Pt instructed to cont with HEP.   Person educated: Patient Education method: Explanation, Demonstration, Tactile cues, Verbal cues Education comprehension: verbalized understanding, returned demonstration, verbal cues required, tactile cues required, and needs further education     HOME EXERCISE PROGRAM: Access Code: [email or web address] URL: [email or web address] Date: 02/16/2022 Prepared by: Ronny Flurry   Exercises - Supine Transversus Abdominis Bracing - Hands on Stomach  - 2-3 x daily - 7 x weekly - 1-2 sets - 10 reps - 3-5 seconds hold - Supine March  - 1-2 x daily - 7 x weekly - 2 sets - 10 reps - Log Roll  - 1 x daily - 1 x weekly - 1 sets - 1 reps - Supine Transversus Abdominis Bracing with Leg Extension  - 1 x daily - 7 x weekly - 2 sets - 10 reps - Hooklying Clamshell with Resistance  - 1 x daily - 4-5 x weekly - 2 sets - 10 reps - Standing Shoulder Row with Anchored Resistance  - 1 x daily - 4-5 x weekly - 2 sets -  10 reps Updated HEP: - Shoulder extension with resistance - Neutral  - 1 x daily - 4-5 x weekly - 2 sets - 10 reps - Supine Bridge  - 1 x daily - 5 x weekly - 2 sets - 10 reps   ASSESSMENT:   CLINICAL IMPRESSION: Pt wearing lumbar wrap/brace today which she wears some during the day  especially with household chores.  PT had pt remove wrap/brace before exercises.  Pt is progressing well with pain, sx's, function, and strength.  Pt continues to perform her walking program without adverse effects.  Pt has improved with performance of transfers.  She continues to require instruction and cuing for correct form with exercises.  Pt demonstrates improved form with instruction/cuing.  Pt responded well to Rx having no pain after Rx.  Pt should benefit from cont skilled PT services to address ongoing goals and to restore desired level of function.      OBJECTIVE IMPAIRMENTS Abnormal gait, decreased activity tolerance, decreased endurance, decreased mobility, difficulty walking, decreased strength, hypomobility, impaired flexibility, impaired sensation, and pain.    ACTIVITY LIMITATIONS carrying, lifting, bending, standing, squatting, transfers, bed mobility, and locomotion level   PARTICIPATION LIMITATIONS: meal prep, cleaning, laundry, shopping, community activity, and yard work   PERSONAL FACTORS 1 comorbidity: DM  are also affecting patient's functional outcome.    REHAB POTENTIAL: Good   CLINICAL DECISION MAKING: Stable/uncomplicated   EVALUATION COMPLEXITY: Low     GOALS:     SHORT TERM GOALS: Target date: 03/09/2022   Pt will be independent and compliant with HEP for improved pain, strength, and function.  Baseline: Goal status: GOAL MET   2.  Pt will demo improved ease with sit to stand transfers and report reduced pain for improved mobility. Baseline:  Goal status: GOAL MET   3.  Pt will demo correct form with log rolling for improved bed mobility. Baseline:  Goal status: GOAL MET Goal Status:  03/02/2022   4.  Pt will demo improved core strength as evidenced by progression of core exercises without adverse effects for improved performance of and tolerance with daily activities and functional mobility   Baseline:  Goal status: GOAL MET       LONG TERM GOALS:  Target date:  05/12/2022   Pt will ambulate with a normalized gait pattern with = Wb'ing without limping  Baseline:  Goal status: PROGRESSING   2.  Pt will report she is able to ambulate extended community distance without significant pain or difficulty.  Baseline:  Goal status: GOAL MET   3.  Pt will be able to perform her normal daily transfers without difficulty.  Baseline:  Goal status: GOAL MET   4.  Pt will be able to perform appropriate household chores without significant pain or difficulty.  Baseline:  Goal status: PROGRESSING   5.  Pt will demo improved R LE strength to = L LE for improved functional mobility. Baseline:  Goal status:  PROGRESSING         PLAN: PT FREQUENCY: 2x/week   PT DURATION: 3-4 weeks   PLANNED INTERVENTIONS: Therapeutic exercises, Therapeutic activity, Neuromuscular re-education, Balance training, Gait training, Patient/Family education, Joint mobilization, Stair training, Aquatic Therapy, Dry Needling, Electrical stimulation, Cryotherapy, Moist heat, Taping, Manual therapy, and Re-evaluation.   PLAN FOR NEXT SESSION:  Progress core strengthening per lumbar fusion protocol.     Selinda Michaels III PT, DPT 04/22/22 9:12 AM

## 2022-04-21 ENCOUNTER — Encounter (HOSPITAL_BASED_OUTPATIENT_CLINIC_OR_DEPARTMENT_OTHER): Payer: Self-pay | Admitting: Physical Therapy

## 2022-04-21 ENCOUNTER — Ambulatory Visit (HOSPITAL_BASED_OUTPATIENT_CLINIC_OR_DEPARTMENT_OTHER): Payer: PPO | Admitting: Physical Therapy

## 2022-04-21 DIAGNOSIS — R262 Difficulty in walking, not elsewhere classified: Secondary | ICD-10-CM

## 2022-04-21 DIAGNOSIS — M6281 Muscle weakness (generalized): Secondary | ICD-10-CM

## 2022-04-21 DIAGNOSIS — M79604 Pain in right leg: Secondary | ICD-10-CM | POA: Diagnosis not present

## 2022-04-23 ENCOUNTER — Encounter (HOSPITAL_BASED_OUTPATIENT_CLINIC_OR_DEPARTMENT_OTHER): Payer: Self-pay | Admitting: Physical Therapy

## 2022-04-23 ENCOUNTER — Ambulatory Visit (HOSPITAL_BASED_OUTPATIENT_CLINIC_OR_DEPARTMENT_OTHER): Payer: PPO | Admitting: Physical Therapy

## 2022-04-23 DIAGNOSIS — M6281 Muscle weakness (generalized): Secondary | ICD-10-CM

## 2022-04-23 DIAGNOSIS — R262 Difficulty in walking, not elsewhere classified: Secondary | ICD-10-CM

## 2022-04-23 DIAGNOSIS — M79604 Pain in right leg: Secondary | ICD-10-CM | POA: Diagnosis not present

## 2022-04-23 NOTE — Therapy (Signed)
OUTPATIENT PHYSICAL THERAPY TREATMENT NOTE   Patient Name: Carmen Davies MRN: [id number] DOB:12-Mar-1951, 71 y.o., female Today's Date: 04/24/2022    END OF SESSION:   PT End of Session - 04/23/22 1356     Visit Number 10    Number of Visits 16    Date for PT Re-Evaluation 05/12/22    Authorization Type HTA    PT Start Time 1352    PT Stop Time 1432    PT Time Calculation (min) 40 min    Activity Tolerance Patient tolerated treatment well    Behavior During Therapy [facility] for tasks assessed/performed                    Past Medical History:  Diagnosis Date   COVID-19 virus detected 10/20/2021   Diabetes mellitus without complication ([place])    Type II, controls with diet   Diverticulitis    Hyperlipidemia    Hypertension    Varicose veins    Past Surgical History:  Procedure Laterality Date   ABDOMINAL HYSTERECTOMY     TRANSFORAMINAL LUMBAR INTERBODY FUSION W/ MIS 1 LEVEL Left 11/13/2021   Procedure: Left Lumbar five-Sacral one Minimally Invasive Transforminal Lumbar Interbody Fusion;  Surgeon: Judith Part, MD;  Location: [place];  Service: Neurosurgery;  Laterality: Left;   Patient Active Problem List   Diagnosis Date Noted   Lumbar radiculopathy 11/13/2021   Preop cardiovascular exam 11/04/2021   LBBB (left bundle branch block) 11/04/2021   Murmur 11/04/2021   Controlled type 2 diabetes with neuropathy ([place]) 11/04/2021   Primary hypertension 11/04/2021   Mixed hyperlipidemia 11/04/2021   Varicose veins of leg with complications 37/85/8850   Varicose veins of lower extremities with other complications 27/74/1287   DM 06/28/2008   COLONIC POLYPS 03/20/2008   HYPERCHOLESTEROLEMIA 03/20/2008   ANXIETY 03/20/2008   HYPERTENSION 03/20/2008   ASTHMATIC BRONCHITIS, ACUTE 03/20/2008   ALLERGIC RHINITIS 03/20/2008   PEPTIC ULCER DISEASE 03/20/2008   DEGENERATIVE JOINT DISEASE 03/20/2008    PCP: Lajean Manes, MD   REFERRING PROVIDER: Judith Part, MD    REFERRING DIAG: M54.16 (ICD-10-CM) - Radiculopathy, lumbar region    Rationale for Evaluation and Treatment Rehabilitation   THERAPY DIAG:  Pain in right leg   Muscle weakness (generalized)   Difficulty in walking, not elsewhere classified   ONSET DATE: DOS 11/13/2021   SUBJECTIVE:                                                                                                                                                                                            SUBJECTIVE STATEMENT: Pt  underwent Left L5-S1 minimally invasive transforaminal lumbar interbody fusion on 11/13/2021.  Pt states MD informed her she could do what she felt comfortable.    Pt is 5 months s/p L5-S1 fusion.  Pt wears a brace a lot at home which helps.  Pt reports compliance with HEP.  Pt denies any adverse effects after prior Rx.  Pt walks 1 hour daily on TM and 4 days/wk in the gym for 45 mins - 1 hour on TM.  Pt denies pain currently and states she has had no pain recently.     FUNCTIONAL IMPROVEMENTS:  sit/stand transfers, gait, initiating ambulation.  Pt is able to water flowers without pain.  Pt performs transfers without difficulty.   FUNCTIONAL LIMITATIONS:  limited in household chores.  Pt states her bed is really high and she does have difficulty getting into her bed.   PERTINENT HISTORY:  L5-S1 fusion on 11/13/2021.  DM   PAIN:  Are you having pain? No Current:  0/10, Worst: 0/10       PRECAUTIONS: Other: Lumbar Fusion   WEIGHT BEARING RESTRICTIONS No   FALLS:  Has patient fallen in last 6 months? No     OCCUPATION: Pt is retired   PLOF: Independent     PATIENT GOALS Pt states she loves cleaning her house and wants to be able to clean her baseboards.  Improved ability to perform sit/stand transfers and perform car transfers.       OBJECTIVE:    DIAGNOSTIC FINDINGS:  Pt is post op   TODAY'S TREATMENT   Therapeutic exercise:  Reviewed current function, response  to prior Rx, and pain levels.  Pt performed: NuStep at L4 with bilat UE/LE x 5 mins Supine bridge with TrA x20 reps   Supine SLR with Tra and with contralateral UE extension 2 x 10 reps S/L clams with RTB 2x10 reps bilat Q-ped alt UE raise with TrA  x 10 reps Q-ped alt LE ext with TrA x 10 reps Q-ped birddogs approx 8 reps   Therapeutic Activities:  (to improve functional mobility and functional strength) Step ups x 10 each LE with TrA on 6 inch step and on 8 inch step Hip hiking 3 x 10 reps on floor  SLS with TrA with light UE support 3x20 sec each leg     PATIENT EDUCATION:  Education details: POC, exercise form, relevant anatomy.  Pt instructed to cont with HEP.   Person educated: Patient Education method: Explanation, Demonstration, Tactile cues, Verbal cues Education comprehension: verbalized understanding, returned demonstration, verbal cues required, tactile cues required, and needs further education     HOME EXERCISE PROGRAM: Access Code: [email or web address] URL: [email or web address] Date: 02/16/2022 Prepared by: Ronny Flurry   Exercises - Supine Transversus Abdominis Bracing - Hands on Stomach  - 2-3 x daily - 7 x weekly - 1-2 sets - 10 reps - 3-5 seconds hold - Supine March  - 1-2 x daily - 7 x weekly - 2 sets - 10 reps - Log Roll  - 1 x daily - 1 x weekly - 1 sets - 1 reps - Supine Transversus Abdominis Bracing with Leg Extension  - 1 x daily - 7 x weekly - 2 sets - 10 reps - Hooklying Clamshell with Resistance  - 1 x daily - 4-5 x weekly - 2 sets - 10 reps - Standing Shoulder Row with Anchored Resistance  - 1 x daily - 4-5 x weekly - 2 sets - 10 reps Updated HEP: - Shoulder  extension with resistance - Neutral  - 1 x daily - 4-5 x weekly - 2 sets - 10 reps - Supine Bridge  - 1 x daily - 5 x weekly - 2 sets - 10 reps   ASSESSMENT:   CLINICAL IMPRESSION: Pt requires cuing and instruction in correct form with exercises.  Pt had difficulty with performing  birddogs correctly.  She requires much cuing for correct form with supine UE ext with contralateral SLR, Q-ped alt LE ext, and Q-ped birddogs.  She is improving with core strength and progressing well with pain, sx's, and function.  Pt responded well to Rx having no pain after Rx.  Pt should benefit from cont skilled PT services to address ongoing goals and to restore desired level of function.     OBJECTIVE IMPAIRMENTS Abnormal gait, decreased activity tolerance, decreased endurance, decreased mobility, difficulty walking, decreased strength, hypomobility, impaired flexibility, impaired sensation, and pain.    ACTIVITY LIMITATIONS carrying, lifting, bending, standing, squatting, transfers, bed mobility, and locomotion level   PARTICIPATION LIMITATIONS: meal prep, cleaning, laundry, shopping, community activity, and yard work   PERSONAL FACTORS 1 comorbidity: DM  are also affecting patient's functional outcome.    REHAB POTENTIAL: Good   CLINICAL DECISION MAKING: Stable/uncomplicated   EVALUATION COMPLEXITY: Low     GOALS:     SHORT TERM GOALS: Target date: 03/09/2022   Pt will be independent and compliant with HEP for improved pain, strength, and function.  Baseline: Goal status: GOAL MET   2.  Pt will demo improved ease with sit to stand transfers and report reduced pain for improved mobility. Baseline:  Goal status: GOAL MET   3.  Pt will demo correct form with log rolling for improved bed mobility. Baseline:  Goal status: GOAL MET Goal Status:  03/02/2022   4.  Pt will demo improved core strength as evidenced by progression of core exercises without adverse effects for improved performance of and tolerance with daily activities and functional mobility   Baseline:  Goal status: GOAL MET       LONG TERM GOALS: Target date:  05/12/2022   Pt will ambulate with a normalized gait pattern with = Wb'ing without limping  Baseline:  Goal status: PROGRESSING   2.  Pt will report  she is able to ambulate extended community distance without significant pain or difficulty.  Baseline:  Goal status: GOAL MET   3.  Pt will be able to perform her normal daily transfers without difficulty.  Baseline:  Goal status: GOAL MET   4.  Pt will be able to perform appropriate household chores without significant pain or difficulty.  Baseline:  Goal status: PROGRESSING   5.  Pt will demo improved R LE strength to = L LE for improved functional mobility. Baseline:  Goal status:  PROGRESSING         PLAN: PT FREQUENCY: 2x/week   PT DURATION: 3-4 weeks   PLANNED INTERVENTIONS: Therapeutic exercises, Therapeutic activity, Neuromuscular re-education, Balance training, Gait training, Patient/Family education, Joint mobilization, Stair training, Aquatic Therapy, Dry Needling, Electrical stimulation, Cryotherapy, Moist heat, Taping, Manual therapy, and Re-evaluation.   PLAN FOR NEXT SESSION:  Progress core strengthening per lumbar fusion protocol.     Selinda Michaels III PT, DPT 04/24/22 1:56 PM

## 2022-05-05 NOTE — Therapy (Signed)
OUTPATIENT PHYSICAL THERAPY TREATMENT NOTE   Patient Name: Carmen Davies MRN: [id number] DOB:[birth year], 71 y.o., female Today's Date: 05/06/2022    END OF SESSION:   PT End of Session - 05/06/22 0808     Visit Number 11    Number of Visits 16    Date for PT Re-Evaluation 05/12/22    Authorization Type HTA    PT Start Time 0802    PT Stop Time 0845    PT Time Calculation (min) 43 min    Activity Tolerance Patient tolerated treatment well    Behavior During Therapy [facility] for tasks assessed/performed                     Past Medical History:  Diagnosis Date   COVID-19 virus detected 10/20/2021   Diabetes mellitus without complication ([place])    Type II, controls with diet   Diverticulitis    Hyperlipidemia    Hypertension    Varicose veins    Past Surgical History:  Procedure Laterality Date   ABDOMINAL HYSTERECTOMY     TRANSFORAMINAL LUMBAR INTERBODY FUSION W/ MIS 1 LEVEL Left 11/13/2021   Procedure: Left Lumbar five-Sacral one Minimally Invasive Transforminal Lumbar Interbody Fusion;  Surgeon: Judith Part, MD;  Location: [place];  Service: Neurosurgery;  Laterality: Left;   Patient Active Problem List   Diagnosis Date Noted   Lumbar radiculopathy 11/13/2021   Preop cardiovascular exam 11/04/2021   LBBB (left bundle branch block) 11/04/2021   Murmur 11/04/2021   Controlled type 2 diabetes with neuropathy ([place]) 11/04/2021   Primary hypertension 11/04/2021   Mixed hyperlipidemia 11/04/2021   Varicose veins of leg with complications 49/67/5916   Varicose veins of lower extremities with other complications 38/46/6599   DM 06/28/2008   COLONIC POLYPS 03/20/2008   HYPERCHOLESTEROLEMIA 03/20/2008   ANXIETY 03/20/2008   HYPERTENSION 03/20/2008   ASTHMATIC BRONCHITIS, ACUTE 03/20/2008   ALLERGIC RHINITIS 03/20/2008   PEPTIC ULCER DISEASE 03/20/2008   DEGENERATIVE JOINT DISEASE 03/20/2008    PCP: Lajean Manes, MD   REFERRING PROVIDER: Judith Part, MD    REFERRING DIAG: M54.16 (ICD-10-CM) - Radiculopathy, lumbar region    Rationale for Evaluation and Treatment Rehabilitation   THERAPY DIAG:  Pain in right leg   Muscle weakness (generalized)   Difficulty in walking, not elsewhere classified   ONSET DATE: DOS 11/13/2021   SUBJECTIVE:                                                                                                                                                                                            SUBJECTIVE STATEMENT:  Pt underwent Left L5-S1 minimally invasive transforaminal lumbar interbody fusion on 11/13/2021.  Pt states MD informed her she could do what she felt comfortable.    Pt is 5.5 months s/p L5-S1 fusion.  Pt wears a brace a lot at home which helps.  Pt reports compliance with HEP.  Pt denies any adverse effects after prior Rx.  Pt walks 1 hour daily on TM and 4 days/wk in the gym for 45 mins - 1 hour on TM.  She [place] more [place] the [place].  Pt denies pain currently and states she has had no pain recently.     FUNCTIONAL IMPROVEMENTS:  sit/stand transfers, gait, initiating ambulation.  Pt is able to water flowers without pain.  Pt performs transfers without difficulty.  Pt able to wash her kitchen floor on her hands and knees without pain.  Improved getting into her bed. FUNCTIONAL LIMITATIONS:  limited in household chores.  Pt states her bed is really high and she does have difficulty getting into her bed.   PERTINENT HISTORY:  L5-S1 fusion on 11/13/2021.  DM   PAIN:  Are you having pain? No Current:  0/10, Worst: 0/10       PRECAUTIONS: Other: Lumbar Fusion   WEIGHT BEARING RESTRICTIONS No   FALLS:  Has patient fallen in last 6 months? No     OCCUPATION: Pt is retired   PLOF: Independent     PATIENT GOALS Pt states she loves cleaning her house and wants to be able to clean her baseboards.  Improved ability to perform sit/stand transfers and perform car transfers.        OBJECTIVE:    DIAGNOSTIC FINDINGS:  Pt is post op   TODAY'S TREATMENT   Therapeutic exercise:  Reviewed current function, response to prior Rx, and pain levels.  Pt performed: NuStep at L4-L5 with bilat UE/LE x 5 mins Standing cable rows with TrA 2x10-12  Supine bridge SL in figure 4 position with TrA 2x10 reps   Supine SLR with Tra and with contralateral UE extension 2 x 10 reps S/L clams with RTB 2x10 reps bilat Q-ped alt UE raise with TrA  x 10 reps Q-ped alt LE ext with TrA x 10 reps Attempted Q-ped birddogs though pt unable to perform correctly   Therapeutic Activities:  (to improve functional mobility and functional strength) Step ups with TrA on 8 inch step 2x10 bilat Hip hiking 3 x 10 reps on floor  SLS with TrA with light UE support 3x20 sec each leg     PATIENT EDUCATION:  Education details: POC, exercise form, relevant anatomy.  Pt instructed to cont with HEP.   Person educated: Patient Education method: Explanation, Demonstration, Tactile cues, Verbal cues Education comprehension: verbalized understanding, returned demonstration, verbal cues required, tactile cues required, and needs further education     HOME EXERCISE PROGRAM: Access Code: [email or web address] URL: [email or web address] Date: 02/16/2022 Prepared by: Ronny Flurry   Exercises - Supine Transversus Abdominis Bracing - Hands on Stomach  - 2-3 x daily - 7 x weekly - 1-2 sets - 10 reps - 3-5 seconds hold - Supine March  - 1-2 x daily - 7 x weekly - 2 sets - 10 reps - Log Roll  - 1 x daily - 1 x weekly - 1 sets - 1 reps - Supine Transversus Abdominis Bracing with Leg Extension  - 1 x daily - 7 x weekly - 2 sets - 10 reps - Hooklying Clamshell with Resistance  -  1 x daily - 4-5 x weekly - 2 sets - 10 reps - Standing Shoulder Row with Anchored Resistance  - 1 x daily - 4-5 x weekly - 2 sets - 10 reps - Shoulder extension with resistance - Neutral  - 1 x daily - 4-5 x weekly - 2 sets - 10  reps - Supine Bridge  - 1 x daily - 5 x weekly - 2 sets - 10 reps   ASSESSMENT:   CLINICAL IMPRESSION: Pt reports improved function and has improved tolerance to household chores including getting into her bed and washing kitchen floor respectively.  Pt continues to improve with core strength and is progressing well with protocol.  Pt still requires much cuing for sequencing with UE and LE moving together with exercises.  She did demonstrates improved form with repetition and cuing.  Pt unable to perform Q-ped birddogs correctly.  Pt responded well to Rx having no pain after Rx.  Pt should benefit from cont skilled PT services to address ongoing goals and to restore desired level of function.     OBJECTIVE IMPAIRMENTS Abnormal gait, decreased activity tolerance, decreased endurance, decreased mobility, difficulty walking, decreased strength, hypomobility, impaired flexibility, impaired sensation, and pain.    ACTIVITY LIMITATIONS carrying, lifting, bending, standing, squatting, transfers, bed mobility, and locomotion level   PARTICIPATION LIMITATIONS: meal prep, cleaning, laundry, shopping, community activity, and yard work   PERSONAL FACTORS 1 comorbidity: DM  are also affecting patient's functional outcome.    REHAB POTENTIAL: Good   CLINICAL DECISION MAKING: Stable/uncomplicated   EVALUATION COMPLEXITY: Low     GOALS:     SHORT TERM GOALS: Target date: 03/09/2022   Pt will be independent and compliant with HEP for improved pain, strength, and function.  Baseline: Goal status: GOAL MET   2.  Pt will demo improved ease with sit to stand transfers and report reduced pain for improved mobility. Baseline:  Goal status: GOAL MET   3.  Pt will demo correct form with log rolling for improved bed mobility. Baseline:  Goal status: GOAL MET Goal Status:  03/02/2022   4.  Pt will demo improved core strength as evidenced by progression of core exercises without adverse effects for  improved performance of and tolerance with daily activities and functional mobility   Baseline:  Goal status: GOAL MET       LONG TERM GOALS: Target date:  05/12/2022   Pt will ambulate with a normalized gait pattern with = Wb'ing without limping  Baseline:  Goal status: PROGRESSING   2.  Pt will report she is able to ambulate extended community distance without significant pain or difficulty.  Baseline:  Goal status: GOAL MET   3.  Pt will be able to perform her normal daily transfers without difficulty.  Baseline:  Goal status: GOAL MET   4.  Pt will be able to perform appropriate household chores without significant pain or difficulty.  Baseline:  Goal status: PROGRESSING   5.  Pt will demo improved R LE strength to = L LE for improved functional mobility. Baseline:  Goal status:  PROGRESSING         PLAN: PT FREQUENCY: 2x/week   PT DURATION: 3-4 weeks   PLANNED INTERVENTIONS: Therapeutic exercises, Therapeutic activity, Neuromuscular re-education, Balance training, Gait training, Patient/Family education, Joint mobilization, Stair training, Aquatic Therapy, Dry Needling, Electrical stimulation, Cryotherapy, Moist heat, Taping, Manual therapy, and Re-evaluation.   PLAN FOR NEXT SESSION:  Progress core strengthening per lumbar fusion protocol.  Add tandem balance next visit.    Selinda Michaels III PT, DPT 05/06/22 9:23 AM

## 2022-05-06 ENCOUNTER — Ambulatory Visit (HOSPITAL_BASED_OUTPATIENT_CLINIC_OR_DEPARTMENT_OTHER): Payer: PPO | Attending: Neurological Surgery | Admitting: Physical Therapy

## 2022-05-06 ENCOUNTER — Encounter (HOSPITAL_BASED_OUTPATIENT_CLINIC_OR_DEPARTMENT_OTHER): Payer: Self-pay | Admitting: Physical Therapy

## 2022-05-06 DIAGNOSIS — M6281 Muscle weakness (generalized): Secondary | ICD-10-CM | POA: Insufficient documentation

## 2022-05-06 DIAGNOSIS — R262 Difficulty in walking, not elsewhere classified: Secondary | ICD-10-CM | POA: Insufficient documentation

## 2022-05-06 DIAGNOSIS — M79604 Pain in right leg: Secondary | ICD-10-CM | POA: Diagnosis not present

## 2022-05-12 ENCOUNTER — Ambulatory Visit (HOSPITAL_BASED_OUTPATIENT_CLINIC_OR_DEPARTMENT_OTHER): Payer: PPO | Admitting: Physical Therapy

## 2022-05-12 DIAGNOSIS — R319 Hematuria, unspecified: Secondary | ICD-10-CM | POA: Diagnosis not present

## 2022-05-12 DIAGNOSIS — Z6827 Body mass index (BMI) 27.0-27.9, adult: Secondary | ICD-10-CM | POA: Diagnosis not present

## 2022-05-12 DIAGNOSIS — N3001 Acute cystitis with hematuria: Secondary | ICD-10-CM | POA: Diagnosis not present

## 2022-05-14 ENCOUNTER — Encounter (HOSPITAL_BASED_OUTPATIENT_CLINIC_OR_DEPARTMENT_OTHER): Payer: Self-pay | Admitting: Physical Therapy

## 2022-05-14 ENCOUNTER — Ambulatory Visit (HOSPITAL_BASED_OUTPATIENT_CLINIC_OR_DEPARTMENT_OTHER): Payer: PPO | Admitting: Physical Therapy

## 2022-05-14 DIAGNOSIS — R262 Difficulty in walking, not elsewhere classified: Secondary | ICD-10-CM

## 2022-05-14 DIAGNOSIS — M79604 Pain in right leg: Secondary | ICD-10-CM

## 2022-05-14 DIAGNOSIS — M6281 Muscle weakness (generalized): Secondary | ICD-10-CM

## 2022-05-14 NOTE — Therapy (Signed)
OUTPATIENT PHYSICAL THERAPY TREATMENT NOTE   Patient Name: Carmen Davies MRN: [id number] DOB:Jan 10, 1951, 71 y.o., female Today's Date: 05/14/2022    END OF SESSION:             Past Medical History:  Diagnosis Date   COVID-19 virus detected 10/20/2021   Diabetes mellitus without complication ([place])    Type II, controls with diet   Diverticulitis    Hyperlipidemia    Hypertension    Varicose veins    Past Surgical History:  Procedure Laterality Date   ABDOMINAL HYSTERECTOMY     TRANSFORAMINAL LUMBAR INTERBODY FUSION W/ MIS 1 LEVEL Left 11/13/2021   Procedure: Left Lumbar five-Sacral one Minimally Invasive Transforminal Lumbar Interbody Fusion;  Surgeon: Judith Part, MD;  Location: [facility];  Service: Neurosurgery;  Laterality: Left;   Patient Active Problem List   Diagnosis Date Noted   Lumbar radiculopathy 11/13/2021   Preop cardiovascular exam 11/04/2021   LBBB (left bundle branch block) 11/04/2021   Murmur 11/04/2021   Controlled type 2 diabetes with neuropathy ([place]) 11/04/2021   Primary hypertension 11/04/2021   Mixed hyperlipidemia 11/04/2021   Varicose veins of leg with complications 42/35/3614   Varicose veins of lower extremities with other complications 43/15/4008   DM 06/28/2008   COLONIC POLYPS 03/20/2008   HYPERCHOLESTEROLEMIA 03/20/2008   ANXIETY 03/20/2008   HYPERTENSION 03/20/2008   ASTHMATIC BRONCHITIS, ACUTE 03/20/2008   ALLERGIC RHINITIS 03/20/2008   PEPTIC ULCER DISEASE 03/20/2008   DEGENERATIVE JOINT DISEASE 03/20/2008    PCP: Lajean Manes, MD   REFERRING PROVIDER: Judith Part, MD    REFERRING DIAG: M54.16 (ICD-10-CM) - Radiculopathy, lumbar region    Rationale for Evaluation and Treatment Rehabilitation   THERAPY DIAG:  Pain in right leg   Muscle weakness (generalized)   Difficulty in walking, not elsewhere classified   ONSET DATE: DOS 11/13/2021   SUBJECTIVE:                                                                                                                                                                                             SUBJECTIVE STATEMENT: Pt underwent Left L5-S1 minimally invasive transforaminal lumbar interbody fusion on 11/13/2021.  Pt states MD informed her she could do what she felt comfortable.    Pt is nearly 6 months s/p L5-S1 fusion.  Pt states she had to cancel last appt due to a UTI.  She is on antibiotics, but doesn't remember what specific medication.  Pt t wears a brace a lot at home which helps.  Pt reports compliance with HEP.  Pt denies any adverse effects after prior Rx.  Pt walks 1 hour daily on TM and 4 days/wk in the gym for 45 mins - 1 hour on TM.  She did [place] more at the beach.  Pt denies pain currently and states she has had no pain recently.     FUNCTIONAL IMPROVEMENTS:  Pt able to get on her knees more for cleaning.  Pt reports she is able to perform appropriate household chores without significant pain or difficulty.  gait, initiating ambulation.  Pt is able to water flowers without pain.  Pt performs transfers without difficulty.  Improved getting into her bed. FUNCTIONAL LIMITATIONS:  limited in household chores.  Pt states her bed is really high and she does have difficulty getting into her bed.   PERTINENT HISTORY:  L5-S1 fusion on 11/13/2021.  DM   PAIN:  Are you having pain? No Current:  0/10, Worst: 0/10       PRECAUTIONS: Other: Lumbar Fusion   WEIGHT BEARING RESTRICTIONS No   FALLS:  Has patient fallen in last 6 months? No     OCCUPATION: Pt is retired   PLOF: Independent     PATIENT GOALS Pt states she loves cleaning her house and wants to be able to clean her baseboards.  Improved ability to perform sit/stand transfers and perform car transfers.       OBJECTIVE:    DIAGNOSTIC FINDINGS:  Pt is post op   TODAY'S TREATMENT   PATIENT SURVEYS:  Pt completed FOTO: Current/Initial:  60/46 with a goal of 62 at visit  #11.     LOWER EXTREMITY STRENGTH:      MMT Right   Left    Hip flexion 5/5 5/5  Hip extension      Hip abduction      Hip adduction      Hip internal rotation      Hip external rotation 4+/5 4+/5  Knee flexion 5/5 tested in sitting 5/5 tested in sitting  Knee extension 5/5 5/5  Ankle dorsiflexion      Ankle plantarflexion      Ankle inversion      Ankle eversion       (Blank rows = not tested)       GAIT: Assistive device utilized: None Level of assistance: Complete Independence Comments:  Pt has improved quality of gait, but still has R hip drop minimally favoring R LE.   Therapeutic exercise:  Reviewed current function, response to prior Rx, and pain levels.  Pt performed: NuStep at L5 with bilat UE/LE x 5 mins Standing cable rows with TrA 2x10  10# Standing cable extension with TrA 2x10 10# Supine bridge SL in figure 4 position with TrA 2x10 reps   Supine SLR with Tra and with contralateral UE extension 2 x 10 reps S/L clams with RTB 2x10 reps bilat Q-ped alt UE raise with TrA  x 10 reps Q-ped alt LE ext with TrA x 10 reps Attempted Q-ped birddogs though pt unable to perform correctly   Therapeutic Activities:  (to improve functional mobility and functional strength) Step ups with TrA on 8 inch step 2x10 bilat Hip hiking 2 x 10 reps on floor  SLS with TrA with light UE support 3x20 sec each leg     PATIENT EDUCATION:  Education details: POC, exercise form, relevant anatomy.  Pt instructed to cont with HEP.   Person educated: Patient Education method: Explanation, Demonstration, Tactile cues, Verbal cues Education comprehension: verbalized understanding, returned demonstration, verbal cues required, tactile cues required,  and needs further education     HOME EXERCISE PROGRAM: Access Code: [email or web address] URL: [email or web address] Date: 02/16/2022 Prepared by: Ronny Flurry   Exercises - Supine Transversus Abdominis Bracing - Hands on Stomach   - 2-3 x daily - 7 x weekly - 1-2 sets - 10 reps - 3-5 seconds hold - Supine March  - 1-2 x daily - 7 x weekly - 2 sets - 10 reps - Log Roll  - 1 x daily - 1 x weekly - 1 sets - 1 reps - Supine Transversus Abdominis Bracing with Leg Extension  - 1 x daily - 7 x weekly - 2 sets - 10 reps - Hooklying Clamshell with Resistance  - 1 x daily - 4-5 x weekly - 2 sets - 10 reps - Standing Shoulder Row with Anchored Resistance  - 1 x daily - 4-5 x weekly - 2 sets - 10 reps - Shoulder extension with resistance - Neutral  - 1 x daily - 4-5 x weekly - 2 sets - 10 reps - Supine Bridge  - 1 x daily - 5 x weekly - 2 sets - 10 reps   ASSESSMENT:   CLINICAL IMPRESSION: Pt reports improved function and has improved tolerance to household chores including getting into her bed and washing kitchen floor respectively.  Pt continues to improve with core strength and is progressing well with protocol.  Pt still requires much cuing for sequencing with UE and LE moving together with exercises.  She did demonstrates improved form with repetition and cuing.  Pt unable to perform Q-ped birddogs correctly.  Pt responded well to Rx having no pain after Rx.  Pt should benefit from cont skilled PT services to address ongoing goals and to restore desired level of function.   Pt demonstrates improved hip flexion strength    OBJECTIVE IMPAIRMENTS Abnormal gait, decreased activity tolerance, decreased endurance, decreased mobility, difficulty walking, decreased strength, hypomobility, impaired flexibility, impaired sensation, and pain.    ACTIVITY LIMITATIONS carrying, lifting, bending, standing, squatting, transfers, bed mobility, and locomotion level   PARTICIPATION LIMITATIONS: meal prep, cleaning, laundry, shopping, community activity, and yard work   PERSONAL FACTORS 1 comorbidity: DM  are also affecting patient's functional outcome.    REHAB POTENTIAL: Good   CLINICAL DECISION MAKING: Stable/uncomplicated   EVALUATION  COMPLEXITY: Low     GOALS:     SHORT TERM GOALS: Target date: 03/09/2022   Pt will be independent and compliant with HEP for improved pain, strength, and function.  Baseline: Goal status: GOAL MET   2.  Pt will demo improved ease with sit to stand transfers and report reduced pain for improved mobility. Baseline:  Goal status: GOAL MET   3.  Pt will demo correct form with log rolling for improved bed mobility. Baseline:  Goal status: GOAL MET Goal Status:  03/02/2022   4.  Pt will demo improved core strength as evidenced by progression of core exercises without adverse effects for improved performance of and tolerance with daily activities and functional mobility   Baseline:  Goal status: GOAL MET       LONG TERM GOALS: Target date:  05/12/2022   Pt will ambulate with a normalized gait pattern with = Wb'ing without limping  Baseline:  Goal status: PARTIALLY MET   2.  Pt will report she is able to ambulate extended community distance without significant pain or difficulty.  Baseline:  Goal status: GOAL MET   3.  Pt will be able to perform her  normal daily transfers without difficulty.  Baseline:  Goal status: GOAL MET   4.  Pt will be able to perform appropriate household chores without significant pain or difficulty.  Baseline:  Goal status: GOAL MET   5.  Pt will demo improved R LE strength to = L LE for improved functional mobility. Baseline:  Goal status:  GOAL MET         PLAN: PT FREQUENCY: 2x/week   PT DURATION: 3-4 weeks   PLANNED INTERVENTIONS: Therapeutic exercises, Therapeutic activity, Neuromuscular re-education, Balance training, Gait training, Patient/Family education, Joint mobilization, Stair training, Aquatic Therapy, Dry Needling, Electrical stimulation, Cryotherapy, Moist heat, Taping, Manual therapy, and Re-evaluation.   PLAN FOR NEXT SESSION:  Progress core strengthening per lumbar fusion protocol.  Progress HEP next visit and ensure  independence.    Selinda Michaels III PT, DPT 05/14/22 6:52 AM

## 2022-05-19 DIAGNOSIS — Z0000 Encounter for general adult medical examination without abnormal findings: Secondary | ICD-10-CM | POA: Diagnosis not present

## 2022-05-19 DIAGNOSIS — I700 Atherosclerosis of aorta: Secondary | ICD-10-CM | POA: Diagnosis not present

## 2022-05-19 DIAGNOSIS — I10 Essential (primary) hypertension: Secondary | ICD-10-CM | POA: Diagnosis not present

## 2022-05-19 DIAGNOSIS — M8580 Other specified disorders of bone density and structure, unspecified site: Secondary | ICD-10-CM | POA: Diagnosis not present

## 2022-05-19 DIAGNOSIS — Z23 Encounter for immunization: Secondary | ICD-10-CM | POA: Diagnosis not present

## 2022-05-19 DIAGNOSIS — Z79899 Other long term (current) drug therapy: Secondary | ICD-10-CM | POA: Diagnosis not present

## 2022-05-19 DIAGNOSIS — E7800 Pure hypercholesterolemia, unspecified: Secondary | ICD-10-CM | POA: Diagnosis not present

## 2022-05-19 DIAGNOSIS — Z780 Asymptomatic menopausal state: Secondary | ICD-10-CM | POA: Diagnosis not present

## 2022-05-19 DIAGNOSIS — E1169 Type 2 diabetes mellitus with other specified complication: Secondary | ICD-10-CM | POA: Diagnosis not present

## 2022-05-19 DIAGNOSIS — Z1331 Encounter for screening for depression: Secondary | ICD-10-CM | POA: Diagnosis not present

## 2022-05-20 ENCOUNTER — Ambulatory Visit: Payer: PPO | Attending: Cardiology | Admitting: Cardiology

## 2022-05-20 ENCOUNTER — Encounter: Payer: Self-pay | Admitting: Cardiology

## 2022-05-20 ENCOUNTER — Ambulatory Visit (HOSPITAL_BASED_OUTPATIENT_CLINIC_OR_DEPARTMENT_OTHER): Payer: PPO | Admitting: Physical Therapy

## 2022-05-20 ENCOUNTER — Encounter (HOSPITAL_BASED_OUTPATIENT_CLINIC_OR_DEPARTMENT_OTHER): Payer: Self-pay | Admitting: Physical Therapy

## 2022-05-20 VITALS — BP 132/70 | HR 70 | Ht 63.0 in | Wt 166.8 lb

## 2022-05-20 DIAGNOSIS — R262 Difficulty in walking, not elsewhere classified: Secondary | ICD-10-CM

## 2022-05-20 DIAGNOSIS — I10 Essential (primary) hypertension: Secondary | ICD-10-CM

## 2022-05-20 DIAGNOSIS — E782 Mixed hyperlipidemia: Secondary | ICD-10-CM

## 2022-05-20 DIAGNOSIS — M79604 Pain in right leg: Secondary | ICD-10-CM | POA: Diagnosis not present

## 2022-05-20 DIAGNOSIS — M6281 Muscle weakness (generalized): Secondary | ICD-10-CM

## 2022-05-20 NOTE — Progress Notes (Signed)
Cardiology Office Note:    Date:  05/20/2022   ID:  Carmen Davies, DOB February 28, 1951, MRN [id number]  PCP:  Lajean Manes, MD  Cardiologist:  Berniece Salines, DO  Electrophysiologist:  None   Referring MD: Lajean Manes, MD   " I am waiting on the surgery having significant back pain"  History of Present Illness:    Carmen Davies is a 71 y.o. female with a hx of hypertension, hyperlipidemia, diabetes type 2 uncontrolled hemoglobin A1c 8.7 back in February 2023 not on any medications, family history of premature coronary artery disease, gestational hypertension is here today for follow-up visit.  For saw the patient on November 04, 2021 at that time she was planning for surgery.  Given findings of her EKG with the on the branch block send the patient for a nuclear stress test as well as an echocardiogram.  Both testing were unremarkable.  She has had her surgery she is doing well.  No complaints at this time.   Past Medical History:  Diagnosis Date   COVID-19 virus detected 10/20/2021   Diabetes mellitus without complication ([place])    Type II, controls with diet   Diverticulitis    Hyperlipidemia    Hypertension    Varicose veins     Past Surgical History:  Procedure Laterality Date   ABDOMINAL HYSTERECTOMY     TRANSFORAMINAL LUMBAR INTERBODY FUSION W/ MIS 1 LEVEL Left 11/13/2021   Procedure: Left Lumbar five-Sacral one Minimally Invasive Transforminal Lumbar Interbody Fusion;  Surgeon: Judith Part, MD;  Location: [facility];  Service: Neurosurgery;  Laterality: Left;    Current Medications: Current Meds  Medication Sig   amLODipine (NORVASC) 5 MG tablet Take 5 mg by mouth daily.   aspirin 81 MG EC tablet Take 81 mg by mouth daily.   atorvastatin (LIPITOR) 10 MG tablet Take 10 mg by mouth daily.   cholecalciferol (VITAMIN D) 25 MCG (1000 UNIT) tablet Take 1,000 Units by mouth daily.   Continuous Blood Gluc Sensor (FREESTYLE LIBRE 14 DAY SENSOR) MISC Apply topically daily.    Cyanocobalamin (VITAMIN B12) 1000 MCG TBCR Take 1,000 mcg by mouth daily.   hydrochlorothiazide (HYDRODIURIL) 12.5 MG tablet Take 12.5 mg by mouth every morning.   JARDIANCE 10 MG TABS tablet Take 10 mg by mouth every morning.   Multiple Vitamins-Minerals (HAIR/SKIN/NAILS/BIOTIN PO) Take 1 capsule by mouth daily.   Multiple Vitamins-Minerals (MULTIVITAMIN WITH MINERALS) tablet Take 1 tablet by mouth daily.   potassium chloride (KLOR-CON) 10 MEQ tablet Take 10 mEq by mouth every morning.   valsartan (DIOVAN) 160 MG tablet Take 160 mg by mouth daily.     Allergies:   Codeine   Social History   Socioeconomic History   Marital status: Married    Spouse name: Not on file   Number of children: Not on file   Years of education: Not on file   Highest education level: Not on file  Occupational History   Not on file  Tobacco Use   Smoking status: Never   Smokeless tobacco: Never  Vaping Use   Vaping Use: Never used  Substance and Sexual Activity   Alcohol use: No   Drug use: No   Sexual activity: Not on file  Other Topics Concern   Not on file  Social History Narrative   Not on file   Social Determinants of Health   Financial Resource Strain: Not on file  Food Insecurity: Not on file  Transportation Needs: Not on file  Physical Activity: Not on file  Stress: Not on file  Social Connections: Not on file     Family History: The patient's family history is not on file.  ROS:   Review of Systems  Constitution: Negative for decreased appetite, fever and weight gain.  HENT: Negative for congestion, ear discharge, hoarse voice and sore throat.   Eyes: Negative for discharge, redness, vision loss in right eye and visual halos.  Cardiovascular: Negative for chest pain, dyspnea on exertion, leg swelling, orthopnea and palpitations.  Respiratory: Negative for cough, hemoptysis, shortness of breath and snoring.   Endocrine: Negative for heat intolerance and polyphagia.   Hematologic/Lymphatic: Negative for bleeding problem. Does not bruise/bleed easily.  Skin: Negative for flushing, nail changes, rash and suspicious lesions.  Musculoskeletal: Negative for arthritis, joint pain, muscle cramps, myalgias, neck pain and stiffness.  Gastrointestinal: Negative for abdominal pain, bowel incontinence, diarrhea and excessive appetite.  Genitourinary: Negative for decreased libido, genital sores and incomplete emptying.  Neurological: Negative for brief paralysis, focal weakness, headaches and loss of balance.  Psychiatric/Behavioral: Negative for altered mental status, depression and suicidal ideas.  Allergic/Immunologic: Negative for HIV exposure and persistent infections.    EKGs/Labs/Other Studies Reviewed:    The following studies were reviewed today:   EKG: None today.  Recent Labs: 11/22/2021: ALT 23; BUN 28; Creatinine, Ser 0.78; Hemoglobin 12.0; Platelets 329; Potassium 3.9; Sodium 137  Recent Lipid Panel    Component Value Date/Time   CHOL 226 (HH) 08/17/2006 1237   TRIG 167 (H) 08/17/2006 1237   HDL 38.3 (L) 08/17/2006 1237   CHOLHDL 5.9 CALC 08/17/2006 1237   VLDL 33 08/17/2006 1237   LDLDIRECT 165.5 08/17/2006 1237    Physical Exam:    VS:  BP 132/70   Pulse 70   Ht '5\' 3"'$  (1.6 m)   Wt 166 lb 12.8 oz (75.7 kg)   SpO2 96%   BMI 29.55 kg/m     Wt Readings from Last 3 Encounters:  05/20/22 166 lb 12.8 oz (75.7 kg)  11/13/21 160 lb (72.6 kg)  11/06/21 167 lb (75.8 kg)     GEN: Well nourished, well developed in no acute distress HEENT: Normal NECK: No JVD; No carotid bruits LYMPHATICS: No lymphadenopathy CARDIAC: S1S2 noted,RRR, no murmurs, rubs, gallops RESPIRATORY:  Clear to auscultation without rales, wheezing or rhonchi  ABDOMEN: Soft, non-tender, non-distended, +bowel sounds, no guarding. EXTREMITIES: No edema, No cyanosis, no clubbing MUSCULOSKELETAL:  No deformity  SKIN: Warm and dry NEUROLOGIC:  Alert and oriented x 3,  non-focal PSYCHIATRIC:  Normal affect, good insight  ASSESSMENT:    No diagnosis found.  PLAN:    She is doing well from a cardiovascular standpoint.  We discussed again her testing result.  All of her questions has been answered.  Blood pressure is acceptable, continue with current antihypertensive regimen.  Diabetes mellitus is being managed by the primary team.  Hyperlipidemia - continue with current statin medication.   The patient is in agreement with the above plan. The patient left the office in stable condition.  The patient will follow up as needed.  Medication Adjustments/Labs and Tests Ordered: Current medicines are reviewed at length with the patient today.  Concerns regarding medicines are outlined above.  No orders of the defined types were placed in this encounter.  No orders of the defined types were placed in this encounter.   There are no Patient Instructions on file for this visit.   Adopting a Healthy Lifestyle.  Know what  a healthy weight is for you (roughly BMI <25) and aim to maintain this   Aim for 7+ servings of fruits and vegetables daily   65-80+ fluid ounces of water or unsweet tea for healthy kidneys   Limit to max 1 drink of alcohol per day; avoid smoking/tobacco   Limit animal fats in diet for cholesterol and heart health - choose grass fed whenever available   Avoid highly processed foods, and foods high in saturated/trans fats   Aim for low stress - take time to unwind and care for your mental health   Aim for 150 min of moderate intensity exercise weekly for heart health, and weights twice weekly for bone health   Aim for 7-9 hours of sleep daily   When it comes to diets, agreement about the perfect plan isnt easy to find, even among the experts. Experts at the [employer] developed an idea known as the Healthy Eating Plate. Just imagine a plate divided into logical, healthy portions.   The emphasis is on diet  quality:   Load up on vegetables and fruits - one-half of your plate: Aim for color and variety, and remember that potatoes dont count.   Go for whole grains - one-quarter of your plate: Whole wheat, barley, wheat berries, quinoa, oats, brown rice, and foods made with them. If you want pasta, go with whole wheat pasta.   Protein power - one-quarter of your plate: Fish, chicken, beans, and nuts are all healthy, versatile protein sources. Limit red meat.   The diet, however, does go beyond the plate, offering a few other suggestions.   Use healthy plant oils, such as olive, canola, soy, corn, sunflower and peanut. Check the labels, and avoid partially hydrogenated oil, which have unhealthy trans fats.   If youre thirsty, drink water. Coffee and tea are good in moderation, but skip sugary drinks and limit milk and dairy products to one or two daily servings.   The type of carbohydrate in the diet is more important than the amount. Some sources of carbohydrates, such as vegetables, fruits, whole grains, and beans-are healthier than others.   Finally, stay active  Signed, Berniece Salines, DO  05/20/2022 9:14 AM    [place]

## 2022-05-20 NOTE — Patient Instructions (Signed)
Medication Instructions:  Your physician recommends that you continue on your current medications as directed. Please refer to the Current Medication list given to you today.  *If you need a refill on your cardiac medications before your next appointment, please call your pharmacy*   Lab Work: None   Testing/Procedures: None   Follow-Up: At [facility], you and your health needs are our priority.  As part of our continuing mission to provide you with exceptional heart care, we have created designated Provider Care Teams.  These Care Teams include your primary Cardiologist (physician) and Advanced Practice Providers (APPs -  Physician Assistants and Nurse Practitioners) who all work together to provide you with the care you need, when you need it.  We recommend signing up for the patient portal called "MyChart".  Sign up information is provided on this After Visit Summary.  MyChart is used to connect with patients for Virtual Visits (Telemedicine).  Patients are able to view lab/test results, encounter notes, upcoming appointments, etc.  Non-urgent messages can be sent to your provider as well.   To learn more about what you can do with MyChart, go to [email or web address].    Your next appointment:    As needed  The format for your next appointment:   In Person  Provider:   Berniece Salines, DO     Other Instructions   Important Information About Sugar

## 2022-05-20 NOTE — Therapy (Signed)
OUTPATIENT PHYSICAL THERAPY TREATMENT NOTE / DISCHARGE    Patient Name: Carmen Davies MRN: [id number] DOB:[birth year], 71 y.o., female Today's Date: 05/21/2022    END OF SESSION:   PT End of Session - 05/20/22 1237     Visit Number 13    Number of Visits 13    Date for PT Re-Evaluation 05/28/22    Authorization Type HTA    PT Start Time 1148    PT Stop Time 1231    PT Time Calculation (min) 43 min    Activity Tolerance Patient tolerated treatment well    Behavior During Therapy [facility] for tasks assessed/performed                Past Medical History:  Diagnosis Date   COVID-19 virus detected 10/20/2021   Diabetes mellitus without complication ([place])    Type II, controls with diet   Diverticulitis    Hyperlipidemia    Hypertension    Varicose veins    Past Surgical History:  Procedure Laterality Date   ABDOMINAL HYSTERECTOMY     TRANSFORAMINAL LUMBAR INTERBODY FUSION W/ MIS 1 LEVEL Left 11/13/2021   Procedure: Left Lumbar five-Sacral one Minimally Invasive Transforminal Lumbar Interbody Fusion;  Surgeon: Judith Part, MD;  Location: [place];  Service: Neurosurgery;  Laterality: Left;   Patient Active Problem List   Diagnosis Date Noted   Lumbar radiculopathy 11/13/2021   Preop cardiovascular exam 11/04/2021   LBBB (left bundle branch block) 11/04/2021   Murmur 11/04/2021   Controlled type 2 diabetes with neuropathy ([place]) 11/04/2021   Primary hypertension 11/04/2021   Mixed hyperlipidemia 11/04/2021   Varicose veins of leg with complications 21/19/4174   Varicose veins of lower extremities with other complications 04/13/4817   DM 06/28/2008   COLONIC POLYPS 03/20/2008   HYPERCHOLESTEROLEMIA 03/20/2008   ANXIETY 03/20/2008   Essential hypertension 03/20/2008   ASTHMATIC BRONCHITIS, ACUTE 03/20/2008   ALLERGIC RHINITIS 03/20/2008   PEPTIC ULCER DISEASE 03/20/2008   DEGENERATIVE JOINT DISEASE 03/20/2008    PCP: Lajean Manes, MD   REFERRING PROVIDER:  Judith Part, MD    REFERRING DIAG: M54.16 (ICD-10-CM) - Radiculopathy, lumbar region    Rationale for Evaluation and Treatment Rehabilitation   THERAPY DIAG:  Pain in right leg   Muscle weakness (generalized)   Difficulty in walking, not elsewhere classified   ONSET DATE: DOS 11/13/2021   SUBJECTIVE:                                                                                                                                                                                            SUBJECTIVE STATEMENT: Pt  underwent Left L5-S1 minimally invasive transforaminal lumbar interbody fusion on 11/13/2021.  Pt sees her lumbar surgeon tomorrow.      Pt is 6 months s/p L5-S1 fusion.  Pt is not wearing the brace as much and hasn't worn the brace yet this week.  Pt reports compliance with HEP.  Pt denies any adverse effects after prior Rx.  Pt walks 1 hour daily on TM.   Pt denies pain currently and states she has had no pain recently.     FUNCTIONAL IMPROVEMENTS:  Pt reports she is able to perform appropriate household chores without significant pain or difficulty.  gait, initiating ambulation.  Pt is able to water flowers without pain.  Pt performs transfers without difficulty.  Improved getting into her bed.    PERTINENT HISTORY:  L5-S1 fusion on 11/13/2021.  DM   PAIN:  Are you having pain? No Current:  0/10, Worst: 0/10       PRECAUTIONS: Other: Lumbar Fusion   WEIGHT BEARING RESTRICTIONS No   FALLS:  Has patient fallen in last 6 months? No     OCCUPATION: Pt is retired   PLOF: Independent     PATIENT GOALS Pt states she loves cleaning her house and wants to be able to clean her baseboards.  Improved ability to perform sit/stand transfers and perform car transfers.       OBJECTIVE:    DIAGNOSTIC FINDINGS:  Pt is post op   TODAY'S TREATMENT   GAIT: Assistive device utilized: None Level of assistance: Complete Independence Comments:  Pt has improved quality  of gait, but still has R hip drop minimally favoring R LE.   Therapeutic exercise:  Reviewed current function, response to prior Rx, and pain levels.  Pt performed: NuStep at L5 with bilat UE/LE x 5 mins Standing cable rows with TrA 2x10  10# Standing cable extension with TrA 2x10 10# Supine bridge SL in figure 4 position with TrA 2x10-15 reps S/L clams with RTB 2x10-15 reps bilat Hip hiking 2 x 10 reps on floor  SLS with TrA with light UE support 3x20 sec each leg  PT reviewed and updated HEP.  Pt received a HEP handout and was educated in correct form and appropriate frequency.      PATIENT EDUCATION:  Education details: POC and exercise form.  Updated and reviewed HEP.   Person educated: Patient Education method: Explanation, Demonstration, Tactile cues, Verbal cues Education comprehension: verbalized understanding, returned demonstration, verbal cues required, tactile cues required, and needs further education     HOME EXERCISE PROGRAM: Access Code: [email or web address] URL: [email or web address] Date: 02/16/2022 Prepared by: Ronny Flurry   Updated HEP: - Standing Hip Hiking  - 1 x daily - 5 x weekly - 2 sets - 10 reps - Single Leg Stance with Support  - 1 x daily - 5-6 x weekly - 3 reps - 20 seconds hold - Figure 4 Bridge  - 1 x daily - 5 x weekly - 2 sets - 10 reps - Clamshell with Resistance  - 1 x daily - 3-4 x weekly - 2 sets - 10 reps    -  Standing cable rows, 2 sets of 10 reps, 2x/wk, 10 lbs    -  Standing cable shoulder extension, 2 sets of 10 reps, 2x/wk, 10 lbs   ASSESSMENT:   CLINICAL IMPRESSION: Pt has made great progress in PT.  She has increased tolerance to activity and household chores.  Pt is consistent with her walking program and is ambulating  extended distance without increased pain.  She has not been having any pain recently.  Pt is able to perform sit to stands without difficulty.  Pt continues to favor R LE minimally with gait though has made  much improvement with gait.  PT reviewed and updated HEP today.  PT educated pt with correct home exercises and gym exercises.  Pt demonstrates good understanding of home/gym program and is independent with program.  Pt met LTG #6.  Pt has met all of her goals except LTG #1 dealing with gait.  Pt is ready for discharge.       OBJECTIVE IMPAIRMENTS Abnormal gait, decreased activity tolerance, decreased endurance, decreased mobility, difficulty walking, decreased strength, hypomobility, impaired flexibility, impaired sensation, and pain.    ACTIVITY LIMITATIONS carrying, lifting, bending, standing, squatting, transfers, bed mobility, and locomotion level   PARTICIPATION LIMITATIONS: meal prep, cleaning, laundry, shopping, community activity, and yard work   PERSONAL FACTORS 1 comorbidity: DM  are also affecting patient's functional outcome.    REHAB POTENTIAL: Good   CLINICAL DECISION MAKING: Stable/uncomplicated   EVALUATION COMPLEXITY: Low     GOALS:     SHORT TERM GOALS: Target date: 03/09/2022   Pt will be independent and compliant with HEP for improved pain, strength, and function.  Baseline: Goal status: GOAL MET   2.  Pt will demo improved ease with sit to stand transfers and report reduced pain for improved mobility. Baseline:  Goal status: GOAL MET   3.  Pt will demo correct form with log rolling for improved bed mobility. Baseline:  Goal status: GOAL MET Goal Status:  03/02/2022   4.  Pt will demo improved core strength as evidenced by progression of core exercises without adverse effects for improved performance of and tolerance with daily activities and functional mobility   Baseline:  Goal status: GOAL MET       LONG TERM GOALS: Target date:  05/12/2022   Pt will ambulate with a normalized gait pattern with = Wb'ing without limping  Baseline:  Goal status: PARTIALLY MET   2.  Pt will report she is able to ambulate extended community distance without significant  pain or difficulty.  Baseline:  Goal status: GOAL MET   3.  Pt will be able to perform her normal daily transfers without difficulty.  Baseline:  Goal status: GOAL MET   4.  Pt will be able to perform appropriate household chores without significant pain or difficulty.  Baseline:  Goal status: GOAL MET   5.  Pt will demo improved R LE strength to = L LE for improved functional mobility. Baseline:  Goal status:  GOAL MET  6.  Pt will be independent with advanced HEP in order to have a consistent routine for improved core, postural, and LE strength to maximize functional mobility and tolerance. Baseline:  GOAL MET Target date:  05/28/22         PLAN:   PLANNED INTERVENTIONS: Therapeutic exercises, Therapeutic activity, Neuromuscular re-education, Balance training, Gait training, Patient/Family education, Joint mobilization, Stair training, Aquatic Therapy, Dry Needling, Electrical stimulation, Cryotherapy, Moist heat, Taping, Manual therapy, and Re-evaluation.   PLAN FOR NEXT SESSION:    Pt to be discharged due to meeting all goals except partially meeting LTG #1.  Pt is pleased with her current functional level and is agreeable with discharge.  Pt will cont with HEP and appropriate gym exercises.    PHYSICAL THERAPY DISCHARGE SUMMARY  Visits from Start of Care: 13  Current functional  level related to goals / functional outcomes: See above   Remaining deficits: See above   Education / Equipment: See above      Selinda Michaels III PT, DPT 05/21/22 9:13 AM

## 2022-05-21 DIAGNOSIS — M5416 Radiculopathy, lumbar region: Secondary | ICD-10-CM | POA: Diagnosis not present

## 2022-05-21 DIAGNOSIS — Z6828 Body mass index (BMI) 28.0-28.9, adult: Secondary | ICD-10-CM | POA: Diagnosis not present

## 2022-06-24 DIAGNOSIS — Z780 Asymptomatic menopausal state: Secondary | ICD-10-CM | POA: Diagnosis not present

## 2022-06-24 DIAGNOSIS — M85852 Other specified disorders of bone density and structure, left thigh: Secondary | ICD-10-CM | POA: Diagnosis not present

## 2022-06-24 DIAGNOSIS — M85851 Other specified disorders of bone density and structure, right thigh: Secondary | ICD-10-CM | POA: Diagnosis not present

## 2022-07-22 DIAGNOSIS — R300 Dysuria: Secondary | ICD-10-CM | POA: Diagnosis not present

## 2022-07-28 DIAGNOSIS — Z1231 Encounter for screening mammogram for malignant neoplasm of breast: Secondary | ICD-10-CM | POA: Diagnosis not present

## 2022-09-23 DIAGNOSIS — Z86010 Personal history of colonic polyps: Secondary | ICD-10-CM | POA: Diagnosis not present

## 2022-09-23 DIAGNOSIS — K648 Other hemorrhoids: Secondary | ICD-10-CM | POA: Diagnosis not present

## 2022-09-23 DIAGNOSIS — Z09 Encounter for follow-up examination after completed treatment for conditions other than malignant neoplasm: Secondary | ICD-10-CM | POA: Diagnosis not present

## 2022-09-23 DIAGNOSIS — K5730 Diverticulosis of large intestine without perforation or abscess without bleeding: Secondary | ICD-10-CM | POA: Diagnosis not present

## 2022-10-21 DIAGNOSIS — E119 Type 2 diabetes mellitus without complications: Secondary | ICD-10-CM | POA: Diagnosis not present

## 2022-10-21 DIAGNOSIS — I700 Atherosclerosis of aorta: Secondary | ICD-10-CM | POA: Diagnosis not present

## 2022-10-21 DIAGNOSIS — M8580 Other specified disorders of bone density and structure, unspecified site: Secondary | ICD-10-CM | POA: Diagnosis not present

## 2022-10-21 DIAGNOSIS — R052 Subacute cough: Secondary | ICD-10-CM | POA: Diagnosis not present

## 2022-10-21 DIAGNOSIS — I10 Essential (primary) hypertension: Secondary | ICD-10-CM | POA: Diagnosis not present

## 2022-10-21 DIAGNOSIS — E7800 Pure hypercholesterolemia, unspecified: Secondary | ICD-10-CM | POA: Diagnosis not present

## 2023-02-04 DIAGNOSIS — M85851 Other specified disorders of bone density and structure, right thigh: Secondary | ICD-10-CM | POA: Diagnosis not present

## 2023-02-04 DIAGNOSIS — E1169 Type 2 diabetes mellitus with other specified complication: Secondary | ICD-10-CM | POA: Diagnosis not present

## 2023-02-04 DIAGNOSIS — I700 Atherosclerosis of aorta: Secondary | ICD-10-CM | POA: Diagnosis not present

## 2023-02-04 DIAGNOSIS — Z0000 Encounter for general adult medical examination without abnormal findings: Secondary | ICD-10-CM | POA: Diagnosis not present

## 2023-02-04 DIAGNOSIS — Z23 Encounter for immunization: Secondary | ICD-10-CM | POA: Diagnosis not present

## 2023-02-04 DIAGNOSIS — R5382 Chronic fatigue, unspecified: Secondary | ICD-10-CM | POA: Diagnosis not present

## 2023-02-04 DIAGNOSIS — E119 Type 2 diabetes mellitus without complications: Secondary | ICD-10-CM | POA: Diagnosis not present

## 2023-02-04 DIAGNOSIS — E7800 Pure hypercholesterolemia, unspecified: Secondary | ICD-10-CM | POA: Diagnosis not present

## 2023-02-04 DIAGNOSIS — I10 Essential (primary) hypertension: Secondary | ICD-10-CM | POA: Diagnosis not present

## 2023-02-15 DIAGNOSIS — E1165 Type 2 diabetes mellitus with hyperglycemia: Secondary | ICD-10-CM | POA: Diagnosis not present

## 2023-04-07 DIAGNOSIS — E7800 Pure hypercholesterolemia, unspecified: Secondary | ICD-10-CM | POA: Diagnosis not present

## 2023-04-07 DIAGNOSIS — E11620 Type 2 diabetes mellitus with diabetic dermatitis: Secondary | ICD-10-CM | POA: Diagnosis not present

## 2023-04-07 DIAGNOSIS — I5030 Unspecified diastolic (congestive) heart failure: Secondary | ICD-10-CM | POA: Diagnosis not present

## 2023-04-07 DIAGNOSIS — L309 Dermatitis, unspecified: Secondary | ICD-10-CM | POA: Diagnosis not present

## 2023-04-07 DIAGNOSIS — E1165 Type 2 diabetes mellitus with hyperglycemia: Secondary | ICD-10-CM | POA: Diagnosis not present

## 2023-05-19 DIAGNOSIS — E1140 Type 2 diabetes mellitus with diabetic neuropathy, unspecified: Secondary | ICD-10-CM | POA: Diagnosis not present

## 2023-05-19 DIAGNOSIS — W57XXXA Bitten or stung by nonvenomous insect and other nonvenomous arthropods, initial encounter: Secondary | ICD-10-CM | POA: Diagnosis not present

## 2023-05-19 DIAGNOSIS — L299 Pruritus, unspecified: Secondary | ICD-10-CM | POA: Diagnosis not present

## 2023-08-09 DIAGNOSIS — Z1231 Encounter for screening mammogram for malignant neoplasm of breast: Secondary | ICD-10-CM | POA: Diagnosis not present

## 2023-08-10 DIAGNOSIS — E1169 Type 2 diabetes mellitus with other specified complication: Secondary | ICD-10-CM | POA: Diagnosis not present

## 2023-08-10 DIAGNOSIS — Z1330 Encounter for screening examination for mental health and behavioral disorders, unspecified: Secondary | ICD-10-CM | POA: Diagnosis not present

## 2023-08-10 DIAGNOSIS — E1159 Type 2 diabetes mellitus with other circulatory complications: Secondary | ICD-10-CM | POA: Diagnosis not present

## 2023-08-10 DIAGNOSIS — E785 Hyperlipidemia, unspecified: Secondary | ICD-10-CM | POA: Diagnosis not present

## 2023-08-10 DIAGNOSIS — I152 Hypertension secondary to endocrine disorders: Secondary | ICD-10-CM | POA: Diagnosis not present

## 2023-08-11 DIAGNOSIS — E782 Mixed hyperlipidemia: Secondary | ICD-10-CM | POA: Diagnosis not present

## 2023-08-11 DIAGNOSIS — I10 Essential (primary) hypertension: Secondary | ICD-10-CM | POA: Diagnosis not present

## 2023-08-11 DIAGNOSIS — Z8249 Family history of ischemic heart disease and other diseases of the circulatory system: Secondary | ICD-10-CM | POA: Diagnosis not present

## 2023-08-12 ENCOUNTER — Other Ambulatory Visit: Payer: Self-pay | Admitting: Internal Medicine

## 2023-08-12 DIAGNOSIS — Z8249 Family history of ischemic heart disease and other diseases of the circulatory system: Secondary | ICD-10-CM

## 2023-08-16 ENCOUNTER — Other Ambulatory Visit: Payer: PPO

## 2023-08-18 DIAGNOSIS — E119 Type 2 diabetes mellitus without complications: Secondary | ICD-10-CM | POA: Diagnosis not present

## 2023-08-18 DIAGNOSIS — H25013 Cortical age-related cataract, bilateral: Secondary | ICD-10-CM | POA: Diagnosis not present

## 2023-08-18 DIAGNOSIS — H2513 Age-related nuclear cataract, bilateral: Secondary | ICD-10-CM | POA: Diagnosis not present

## 2023-09-03 DIAGNOSIS — R0789 Other chest pain: Secondary | ICD-10-CM | POA: Diagnosis not present

## 2023-09-06 ENCOUNTER — Other Ambulatory Visit: Payer: PPO

## 2023-09-09 ENCOUNTER — Ambulatory Visit
Admission: RE | Admit: 2023-09-09 | Discharge: 2023-09-09 | Disposition: A | Discharge status: Home / Self Care | Payer: HMO | Source: Ambulatory Visit | Attending: Internal Medicine | Admitting: Internal Medicine

## 2023-09-09 DIAGNOSIS — Z8249 Family history of ischemic heart disease and other diseases of the circulatory system: Secondary | ICD-10-CM

## 2023-12-01 DIAGNOSIS — E119 Type 2 diabetes mellitus without complications: Secondary | ICD-10-CM | POA: Diagnosis not present

## 2023-12-15 DIAGNOSIS — Z1330 Encounter for screening examination for mental health and behavioral disorders, unspecified: Secondary | ICD-10-CM | POA: Diagnosis not present

## 2023-12-15 DIAGNOSIS — E1159 Type 2 diabetes mellitus with other circulatory complications: Secondary | ICD-10-CM | POA: Diagnosis not present

## 2023-12-15 DIAGNOSIS — I152 Hypertension secondary to endocrine disorders: Secondary | ICD-10-CM | POA: Diagnosis not present

## 2023-12-15 DIAGNOSIS — E785 Hyperlipidemia, unspecified: Secondary | ICD-10-CM | POA: Diagnosis not present

## 2023-12-15 DIAGNOSIS — E1169 Type 2 diabetes mellitus with other specified complication: Secondary | ICD-10-CM | POA: Diagnosis not present

## 2023-12-21 DIAGNOSIS — L821 Other seborrheic keratosis: Secondary | ICD-10-CM | POA: Diagnosis not present

## 2023-12-21 DIAGNOSIS — L720 Epidermal cyst: Secondary | ICD-10-CM | POA: Diagnosis not present

## 2023-12-21 DIAGNOSIS — D1724 Benign lipomatous neoplasm of skin and subcutaneous tissue of left leg: Secondary | ICD-10-CM | POA: Diagnosis not present

## 2023-12-21 DIAGNOSIS — L578 Other skin changes due to chronic exposure to nonionizing radiation: Secondary | ICD-10-CM | POA: Diagnosis not present

## 2024-01-28 IMAGING — CT CT HEAD W/O CM
3 series · 16 of 47 positions shown, 19 images · non-contrast
Comparison: No priors.

CLINICAL DATA: 70-year-old female with history of psychosis.



[Series 2: head wo · axial · 0.43mm/px · z∈[+1457,+1582]mm · 10 of 30 slices shown, 13 images]
[im 3/30  brain]
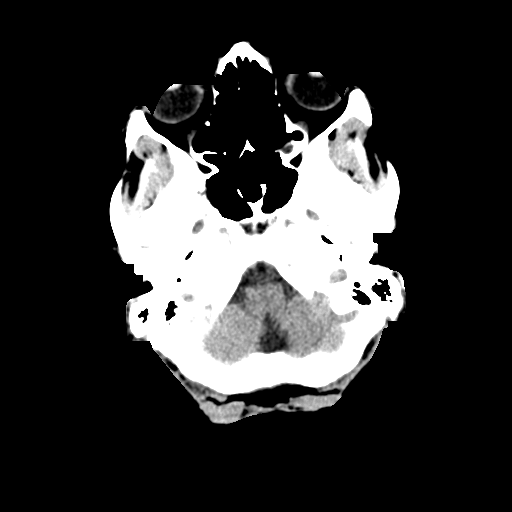
[im 3/30  bone]
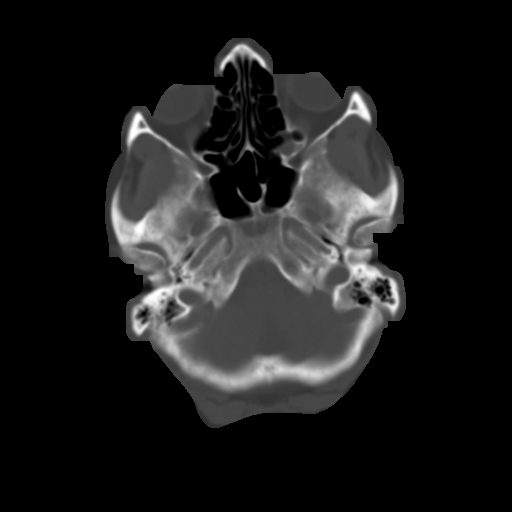
[im 6/30  brain]
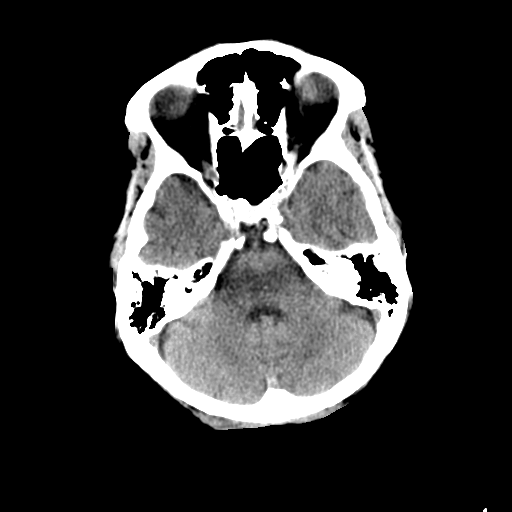
[im 9/30  brain]
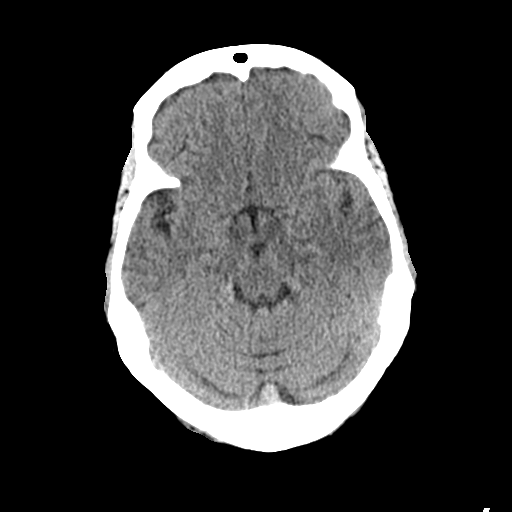
[im 11/30  brain]
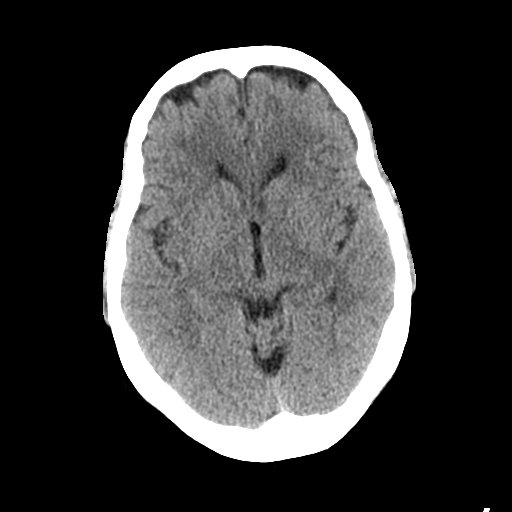
[im 14/30  brain]
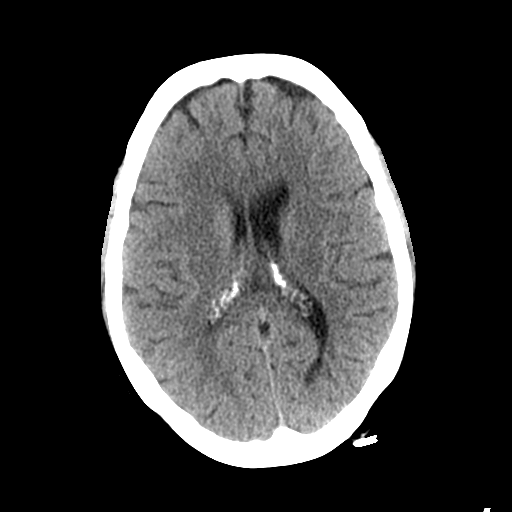
[im 14/30  bone]
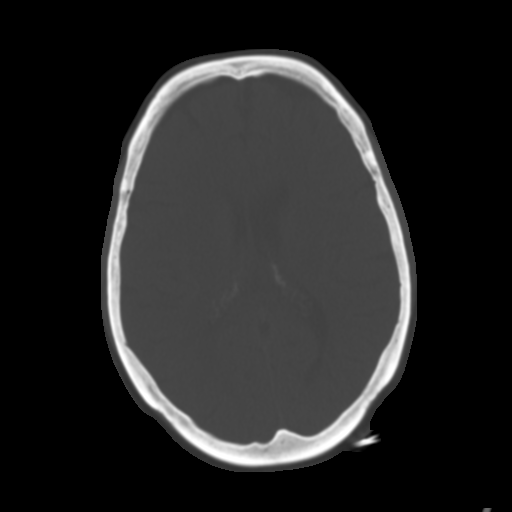
[im 17/30  brain]
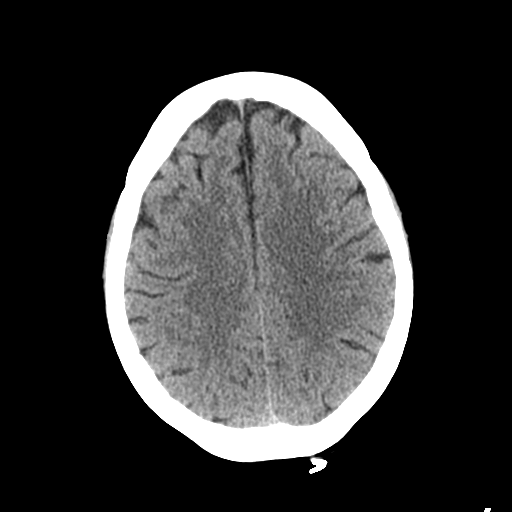
[im 20/30  brain]
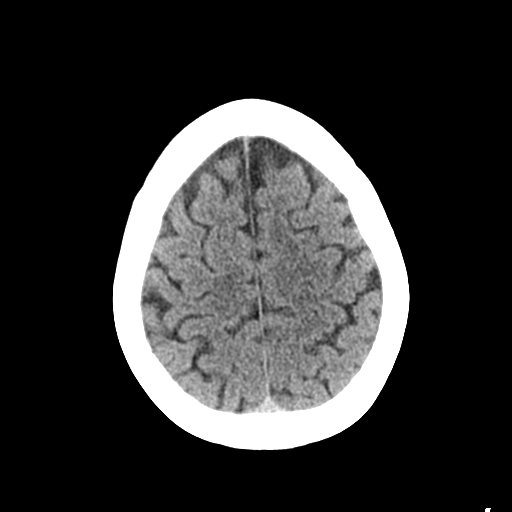
[im 23/30  brain]
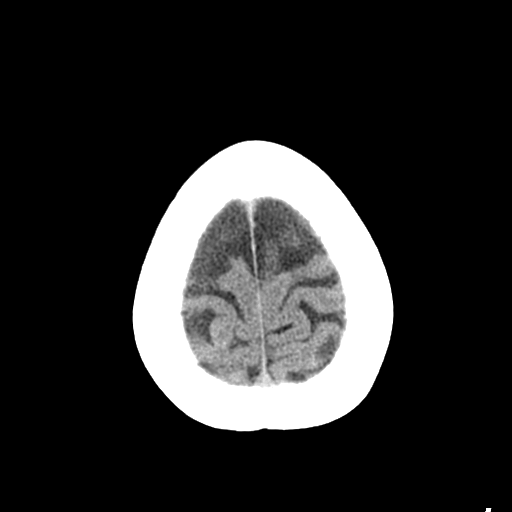
[im 25/30  brain]
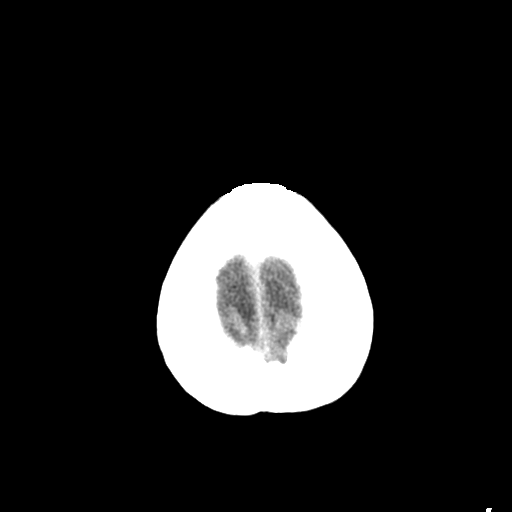
[im 25/30  bone]
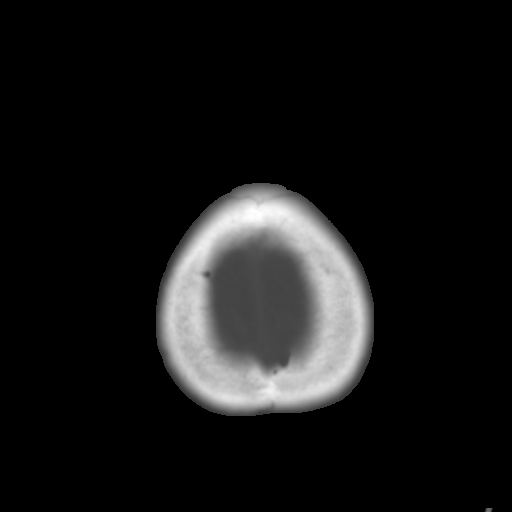
[im 28/30  brain]
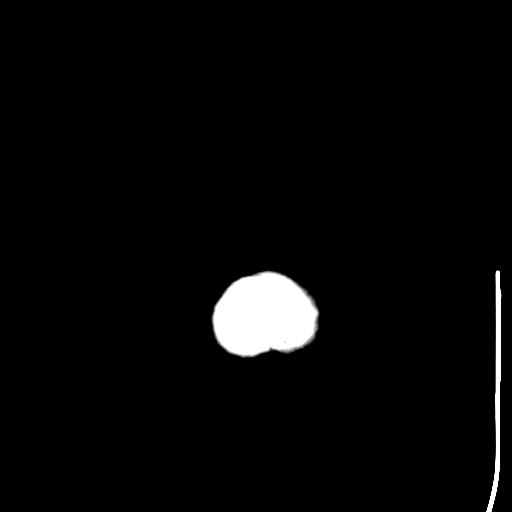

[Series 3: coronal soft tissue · coronal · 0.31mm/px · 3 of 66 slices shown]
[im 22/66  brain]
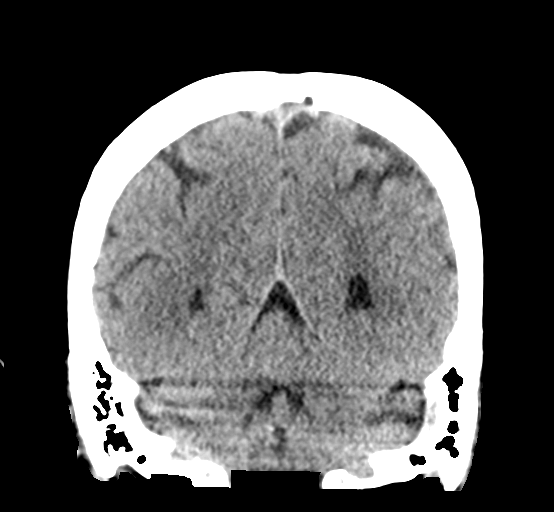
[im 29/66  brain]
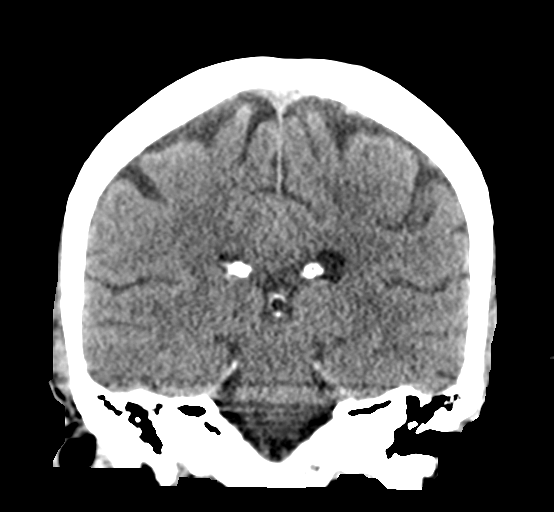
[im 37/66  brain]
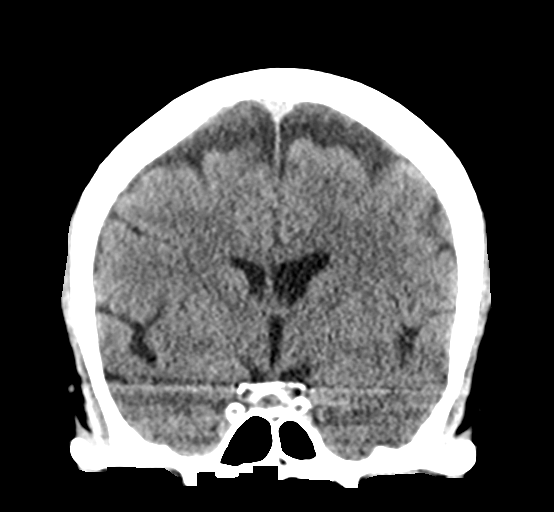

[Series 4: sagittal soft tissue · sagittal · 0.29mm/px · 3 of 53 slices shown]
[im 18/53  brain]
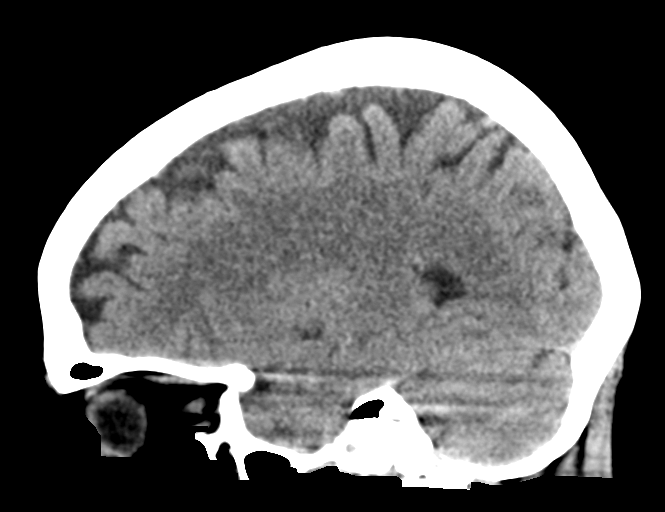
[im 27/53  brain]
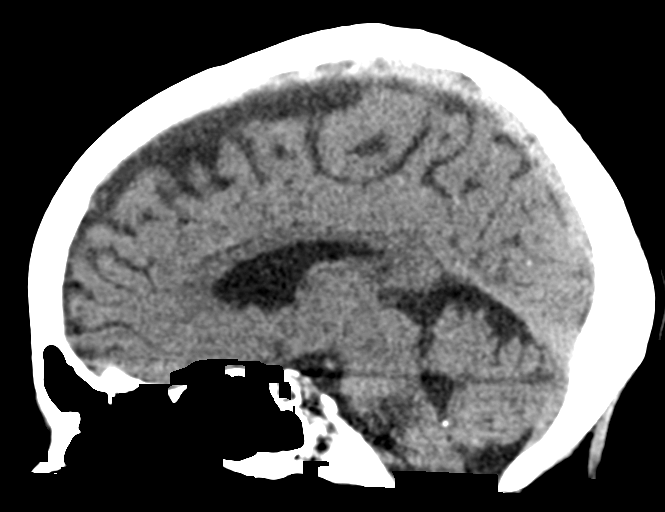
[im 35/53  brain]
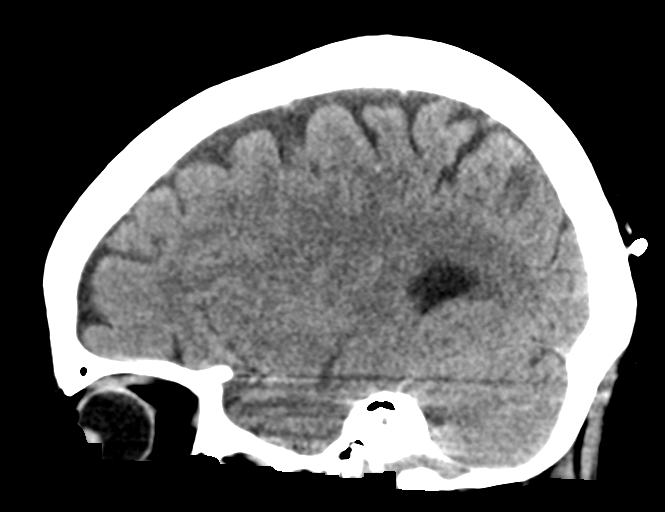

[16 of 47 positions shown; findings below may reference images not displayed]

FINDINGS: Brain: No evidence of acute infarction, hemorrhage, hydrocephalus,
extra-axial collection or mass lesion/mass effect.

Vascular: No hyperdense vessel or unexpected calcification.

Skull: Normal. Negative for fracture or focal lesion.

Sinuses/Orbits: No acute finding.

Other: None.
IMPRESSION: 1. No acute intracranial abnormalities. The appearance of the brain
is normal.

## 2024-02-15 NOTE — Progress Notes (Signed)
 Cardiology Office Note    Date:  02/17/2024  ID:  Carmen Davies, DOB [birth year], MRN [id number] PCP:  Alvie Ax, MD (Inactive)  Cardiologist:  Jerryl Morin, DO  Electrophysiologist:  None   Chief Complaint: Follow up for left bundle branch block   History of Present Illness: .    Carmen Davies is a 73 y.o. female with visit-pertinent history of hypertension, hyperlipidemia, type 2 diabetes mellitus.  She has family history of premature coronary artery disease.  Patient first evaluated by Dr. Emmette Harms on 11/04/2021 for preoperative cardiac evaluation.  Echocardiogram on 11/05/2021 indicated LVEF of 55 to 60%, no RWMA, G1 DD, RV systolic function and size was normal, LA was mildly dilated, there was trivial mitral valve regurgitation with no evidence of mitral stenosis, aortic valve sclerosis was present without evidence of stenosis, aortic valve regurgitation was mild.  MPI on 11/06/2021 was normal and low risk.  Patient was last seen in clinic on 05/20/2022, she had no complaints at that time.  Today she presents for follow-up.  She reports that she is doing very well overall.  She denies any chest pain, shortness of breath, lower extremity edema, orthopnea or PND.  She denies any palpitations, presyncope or syncope.  Patient notes that she had a recent MRA that indicated some calcifications.  She has no cardiac concerns or complaints today.  ROS: .   Today she denies chest pain, shortness of breath, lower extremity edema, fatigue, palpitations, melena, hematuria, hemoptysis, diaphoresis, weakness, presyncope, syncope, orthopnea, and PND.  All other systems are reviewed and otherwise negative. Studies Reviewed: Carmen Davies    EKG:  EKG is ordered today, personally reviewed, demonstrating  EKG Interpretation Date/Time:  Wednesday February 16 2024 11:25:51 EDT Ventricular Rate:  70 PR Interval:  188 QRS Duration:  152 QT Interval:  460 QTC Calculation: 496 R Axis:   -7  Text Interpretation: Normal  sinus rhythm Left bundle branch block When compared with ECG of 22-Nov-2021 09:14, No significant change since last tracing Confirmed by Carmen Davies [phone]) on 02/17/2024 11:09:49 AM   CV Studies: Cardiac studies reviewed are outlined and summarized above. Otherwise please see EMR for full report. Cardiac Studies & Procedures   ______________________________________________________________________________________________   STRESS TESTS  MYOCARDIAL PERFUSION IMAGING 11/06/2021  Narrative   The study is normal. The study is low risk.   No ST deviation was noted.   LV perfusion is abnormal. Defect 1: There is a small defect with mild reduction in uptake present in the mid anteroseptal location(s) that is reversible. There is normal wall motion in the defect area. Consistent with artifact caused by left bundle branch block.   Left ventricular function is abnormal. Global function is mildly reduced. End diastolic cavity size is normal.   Prior study not available for comparison.  There is a small mid anteroseptal perfusion defect, mild in severity, highly suggestive of LBBB related artifact. Low risk stress nuclear study with otherwise normal perfusion and borderline reduced left ventricular global systolic function.   ECHOCARDIOGRAM  ECHOCARDIOGRAM COMPLETE 11/05/2021  Narrative ECHOCARDIOGRAM REPORT    Patient Name:   Carmen Davies Date of Exam: 11/05/2021 Medical Rec #:  [id number]       Height:       63.0 in Accession #:    [id number]      Weight:       167.4 lb Date of Birth:  June 02, 1951        BSA:  1.793 m Patient Age:    70 years        BP:           150/96 mmHg Patient Gender: F               HR:           80 bpm. Exam Location:  [address]  Procedure: 2D Echo, Cardiac Doppler, Color Doppler and Intracardiac Opacification Agent  Indications:    R01.1 Murmur  History:        Patient has no prior history of Echocardiogram examinations. Risk Factors:Diabetes,  Dyslipidemia and Hypertension.  Sonographer:    Brigid Canada RDCS Referring Phys: [id number] KARDIE TOBB  IMPRESSIONS   1. Left ventricular ejection fraction, by estimation, is 55 to 60%. The left ventricle has normal function. The left ventricle has no regional wall motion abnormalities. Left ventricular diastolic parameters are consistent with Grade I diastolic dysfunction (impaired relaxation). 2. Right ventricular systolic function is normal. The right ventricular size is normal. 3. Left atrial size was mildly dilated. 4. The mitral valve is normal in structure. Trivial mitral valve regurgitation. No evidence of mitral stenosis. 5. The aortic valve is tricuspid. There is mild calcification of the aortic valve. Aortic valve regurgitation is mild. Aortic valve sclerosis is present, with no evidence of aortic valve stenosis. 6. The inferior vena cava is normal in size with greater than 50% respiratory variability, suggesting right atrial pressure of 3 mmHg.  FINDINGS Left Ventricle: Left ventricular ejection fraction, by estimation, is 55 to 60%. The left ventricle has normal function. The left ventricle has no regional wall motion abnormalities. Definity  contrast agent was given IV to delineate the left ventricular endocardial borders. The left ventricular internal cavity size was normal in size. There is no left ventricular hypertrophy. Abnormal (paradoxical) septal motion, consistent with left bundle branch block. Left ventricular diastolic parameters are consistent with Grade I diastolic dysfunction (impaired relaxation). Indeterminate filling pressures.  Right Ventricle: The right ventricular size is normal. No increase in right ventricular wall thickness. Right ventricular systolic function is normal.  Left Atrium: Left atrial size was mildly dilated.  Right Atrium: Right atrial size was normal in size.  Pericardium: There is no evidence of pericardial effusion.  Mitral  Valve: The mitral valve is normal in structure. Trivial mitral valve regurgitation. No evidence of mitral valve stenosis.  Tricuspid Valve: The tricuspid valve is normal in structure. Tricuspid valve regurgitation is not demonstrated. No evidence of tricuspid stenosis.  Aortic Valve: The aortic valve is tricuspid. There is mild calcification of the aortic valve. Aortic valve regurgitation is mild. Aortic valve sclerosis is present, with no evidence of aortic valve stenosis.  Pulmonic Valve: The pulmonic valve was normal in structure. Pulmonic valve regurgitation is not visualized. No evidence of pulmonic stenosis.  Aorta: The aortic root is normal in size and structure.  Venous: The inferior vena cava is normal in size with greater than 50% respiratory variability, suggesting right atrial pressure of 3 mmHg.  IAS/Shunts: No atrial level shunt detected by color flow Doppler.   LEFT VENTRICLE PLAX 2D LVIDd:         4.40 cm   Diastology LVIDs:         2.70 cm   LV e' medial:    4.97 cm/s LV PW:         1.00 cm   LV E/e' medial:  11.8 LV IVS:        1.00 cm  LV e' lateral:   5.10 cm/s LVOT diam:     2.00 cm   LV E/e' lateral: 11.5 LV SV:         70 LV SV Index:   39 LVOT Area:     3.14 cm   RIGHT VENTRICLE RV Basal diam:  3.90 cm RV S prime:     18.30 cm/s TAPSE (M-mode): 2.2 cm  LEFT ATRIUM             Index        RIGHT ATRIUM           Index LA diam:        4.00 cm 2.23 cm/m   RA Area:     10.10 cm LA Vol (A2C):   36.8 ml 20.53 ml/m  RA Volume:   22.30 ml  12.44 ml/m LA Vol (A4C):   39.3 ml 21.92 ml/m LA Biplane Vol: 38.3 ml 21.36 ml/m AORTIC VALVE LVOT Vmax:   123.00 cm/s LVOT Vmean:  78.900 cm/s LVOT VTI:    0.222 m  AORTA Ao Root diam: 2.70 cm  MITRAL VALVE MV Area (PHT): 3.98 cm    SHUNTS MV Decel Time: 191 msec    Systemic VTI:  0.22 m MV E velocity: 58.65 cm/s  Systemic Diam: 2.00 cm MV A velocity: 90.05 cm/s MV E/A ratio:  0.65  Mihai Croitoru  MD Electronically signed by Luana Rumple MD Signature Date/Time: 11/05/2021/6:49:10 PM    Final          ______________________________________________________________________________________________       Current Reported Medications:.    Current Meds  Medication Sig   tirzepatide (MOUNJARO) 5 MG/0.5ML Pen Inject 5 mg into the skin once a week.    Physical Exam:    VS:  BP (!) 124/58   Pulse 69   Ht 5' 3 (1.6 m)   Wt 144 lb (65.3 kg)   SpO2 94%   BMI 25.51 kg/m    Wt Readings from Last 3 Encounters:  02/16/24 144 lb (65.3 kg)  05/20/22 166 lb 12.8 oz (75.7 kg)  11/13/21 160 lb (72.6 kg)    GEN: Well nourished, well developed in no acute distress NECK: No JVD; No carotid bruits CARDIAC: RRR, no murmurs, rubs, gallops RESPIRATORY:  Clear to auscultation without rales, wheezing or rhonchi  ABDOMEN: Soft, non-tender, non-distended EXTREMITIES:  No edema; No acute deformity     Asessement and Plan:.    LBBB: Stable on EKG today.  Patient denies any palpitations, dizziness, lightheadedness, presyncope or syncope.  Prior echocardiogram and stress test reassuring as noted above.  Reviewed ED precautions. Heart healthy diet and regular cardiovascular exercise encouraged.    Hypertension: Blood pressure today 124/58.  On amlodipine  5 mg daily, hydrochlorothiazide  12.5 daily and valsartan 160 mg daily.  Hyperlipidemia: Lipid profile on 04/08/2023 indicated LDL 74.  Continue atorvastatin  10 mg daily.   Type 2 DM: Last hemoglobin A1C 5.9 on 12/15/2023.  Followed by [facility] endocrinology.  Cerebrovascular disease: MRA in 08/2023 indicated intracranial atherosclerosis. Continue statin and aspirin. Heart healthy diet and regular cardiovascular exercise encouraged.     Disposition: F/u with Dr. Emmette Harms in one year or sooner if needed.   Signed, Lucyann Romano D Talani Brazee, NP

## 2024-02-16 ENCOUNTER — Ambulatory Visit: Attending: Cardiology | Admitting: Cardiology

## 2024-02-16 ENCOUNTER — Encounter: Payer: Self-pay | Admitting: Cardiology

## 2024-02-16 VITALS — BP 124/58 | HR 69 | Ht 63.0 in | Wt 144.0 lb

## 2024-02-16 DIAGNOSIS — E782 Mixed hyperlipidemia: Secondary | ICD-10-CM | POA: Diagnosis not present

## 2024-02-16 DIAGNOSIS — E1140 Type 2 diabetes mellitus with diabetic neuropathy, unspecified: Secondary | ICD-10-CM | POA: Diagnosis not present

## 2024-02-16 DIAGNOSIS — I10 Essential (primary) hypertension: Secondary | ICD-10-CM | POA: Diagnosis not present

## 2024-02-16 DIAGNOSIS — I447 Left bundle-branch block, unspecified: Secondary | ICD-10-CM

## 2024-02-16 NOTE — Patient Instructions (Signed)
 Medication Instructions:  No changes *If you need a refill on your cardiac medications before your next appointment, please call your pharmacy*  Lab Work: No labs  Testing/Procedures: No testing  Follow-Up: At [facility], you and your health needs are our priority.  As part of our continuing mission to provide you with exceptional heart care, our providers are all part of one team.  This team includes your primary Cardiologist (physician) and Advanced Practice Providers or APPs (Physician Assistants and Nurse Practitioners) who all work together to provide you with the care you need, when you need it.  Your next appointment:   1 year(s)  Provider:   Kardie Tobb, DO    We recommend signing up for the patient portal called "MyChart".  Sign up information is provided on this After Visit Summary.  MyChart is used to connect with patients for Virtual Visits (Telemedicine).  Patients are able to view lab/test results, encounter notes, upcoming appointments, etc.  Non-urgent messages can be sent to your provider as well.   To learn more about what you can do with MyChart, go to [email or web address].

## 2024-02-17 ENCOUNTER — Encounter: Payer: Self-pay | Admitting: Cardiology

## 2024-02-25 DIAGNOSIS — I5030 Unspecified diastolic (congestive) heart failure: Secondary | ICD-10-CM | POA: Diagnosis not present

## 2024-02-25 DIAGNOSIS — E1165 Type 2 diabetes mellitus with hyperglycemia: Secondary | ICD-10-CM | POA: Diagnosis not present

## 2024-02-25 DIAGNOSIS — E1140 Type 2 diabetes mellitus with diabetic neuropathy, unspecified: Secondary | ICD-10-CM | POA: Diagnosis not present

## 2024-02-25 DIAGNOSIS — I10 Essential (primary) hypertension: Secondary | ICD-10-CM | POA: Diagnosis not present

## 2024-03-25 DIAGNOSIS — I10 Essential (primary) hypertension: Secondary | ICD-10-CM | POA: Diagnosis not present

## 2024-03-25 DIAGNOSIS — E1140 Type 2 diabetes mellitus with diabetic neuropathy, unspecified: Secondary | ICD-10-CM | POA: Diagnosis not present

## 2024-03-25 DIAGNOSIS — I5030 Unspecified diastolic (congestive) heart failure: Secondary | ICD-10-CM | POA: Diagnosis not present

## 2024-03-25 DIAGNOSIS — E1165 Type 2 diabetes mellitus with hyperglycemia: Secondary | ICD-10-CM | POA: Diagnosis not present

## 2024-03-30 DIAGNOSIS — I5030 Unspecified diastolic (congestive) heart failure: Secondary | ICD-10-CM | POA: Diagnosis not present

## 2024-03-30 DIAGNOSIS — I10 Essential (primary) hypertension: Secondary | ICD-10-CM | POA: Diagnosis not present

## 2024-03-30 DIAGNOSIS — E7800 Pure hypercholesterolemia, unspecified: Secondary | ICD-10-CM | POA: Diagnosis not present

## 2024-03-30 DIAGNOSIS — E1140 Type 2 diabetes mellitus with diabetic neuropathy, unspecified: Secondary | ICD-10-CM | POA: Diagnosis not present

## 2024-03-30 DIAGNOSIS — E782 Mixed hyperlipidemia: Secondary | ICD-10-CM | POA: Diagnosis not present

## 2024-03-30 DIAGNOSIS — E1165 Type 2 diabetes mellitus with hyperglycemia: Secondary | ICD-10-CM | POA: Diagnosis not present

## 2024-04-24 DIAGNOSIS — I10 Essential (primary) hypertension: Secondary | ICD-10-CM | POA: Diagnosis not present

## 2024-04-24 DIAGNOSIS — I5030 Unspecified diastolic (congestive) heart failure: Secondary | ICD-10-CM | POA: Diagnosis not present

## 2024-04-24 DIAGNOSIS — E1165 Type 2 diabetes mellitus with hyperglycemia: Secondary | ICD-10-CM | POA: Diagnosis not present

## 2024-04-24 DIAGNOSIS — E1140 Type 2 diabetes mellitus with diabetic neuropathy, unspecified: Secondary | ICD-10-CM | POA: Diagnosis not present

## 2024-04-30 DIAGNOSIS — E7800 Pure hypercholesterolemia, unspecified: Secondary | ICD-10-CM | POA: Diagnosis not present

## 2024-04-30 DIAGNOSIS — E1165 Type 2 diabetes mellitus with hyperglycemia: Secondary | ICD-10-CM | POA: Diagnosis not present

## 2024-04-30 DIAGNOSIS — I5030 Unspecified diastolic (congestive) heart failure: Secondary | ICD-10-CM | POA: Diagnosis not present

## 2024-04-30 DIAGNOSIS — E782 Mixed hyperlipidemia: Secondary | ICD-10-CM | POA: Diagnosis not present

## 2024-04-30 DIAGNOSIS — E1140 Type 2 diabetes mellitus with diabetic neuropathy, unspecified: Secondary | ICD-10-CM | POA: Diagnosis not present

## 2024-04-30 DIAGNOSIS — I10 Essential (primary) hypertension: Secondary | ICD-10-CM | POA: Diagnosis not present

## 2024-05-17 DIAGNOSIS — E785 Hyperlipidemia, unspecified: Secondary | ICD-10-CM | POA: Diagnosis not present

## 2024-05-17 DIAGNOSIS — E119 Type 2 diabetes mellitus without complications: Secondary | ICD-10-CM | POA: Diagnosis not present

## 2024-05-17 DIAGNOSIS — E1159 Type 2 diabetes mellitus with other circulatory complications: Secondary | ICD-10-CM | POA: Diagnosis not present

## 2024-05-17 DIAGNOSIS — E1169 Type 2 diabetes mellitus with other specified complication: Secondary | ICD-10-CM | POA: Diagnosis not present

## 2024-05-17 DIAGNOSIS — I152 Hypertension secondary to endocrine disorders: Secondary | ICD-10-CM | POA: Diagnosis not present

## 2024-05-18 DIAGNOSIS — E119 Type 2 diabetes mellitus without complications: Secondary | ICD-10-CM | POA: Diagnosis not present

## 2024-05-24 DIAGNOSIS — E1165 Type 2 diabetes mellitus with hyperglycemia: Secondary | ICD-10-CM | POA: Diagnosis not present

## 2024-05-24 DIAGNOSIS — I10 Essential (primary) hypertension: Secondary | ICD-10-CM | POA: Diagnosis not present

## 2024-05-24 DIAGNOSIS — I5030 Unspecified diastolic (congestive) heart failure: Secondary | ICD-10-CM | POA: Diagnosis not present

## 2024-05-24 DIAGNOSIS — E1140 Type 2 diabetes mellitus with diabetic neuropathy, unspecified: Secondary | ICD-10-CM | POA: Diagnosis not present

## 2024-05-30 DIAGNOSIS — I10 Essential (primary) hypertension: Secondary | ICD-10-CM | POA: Diagnosis not present

## 2024-05-30 DIAGNOSIS — E1165 Type 2 diabetes mellitus with hyperglycemia: Secondary | ICD-10-CM | POA: Diagnosis not present

## 2024-05-30 DIAGNOSIS — E1140 Type 2 diabetes mellitus with diabetic neuropathy, unspecified: Secondary | ICD-10-CM | POA: Diagnosis not present

## 2024-05-30 DIAGNOSIS — I5030 Unspecified diastolic (congestive) heart failure: Secondary | ICD-10-CM | POA: Diagnosis not present

## 2024-05-30 DIAGNOSIS — E7800 Pure hypercholesterolemia, unspecified: Secondary | ICD-10-CM | POA: Diagnosis not present

## 2024-05-30 DIAGNOSIS — E782 Mixed hyperlipidemia: Secondary | ICD-10-CM | POA: Diagnosis not present

## 2024-06-08 DIAGNOSIS — E119 Type 2 diabetes mellitus without complications: Secondary | ICD-10-CM | POA: Diagnosis not present

## 2024-06-23 DIAGNOSIS — I10 Essential (primary) hypertension: Secondary | ICD-10-CM | POA: Diagnosis not present

## 2024-06-23 DIAGNOSIS — I5030 Unspecified diastolic (congestive) heart failure: Secondary | ICD-10-CM | POA: Diagnosis not present

## 2024-06-23 DIAGNOSIS — E1140 Type 2 diabetes mellitus with diabetic neuropathy, unspecified: Secondary | ICD-10-CM | POA: Diagnosis not present

## 2024-06-23 DIAGNOSIS — E1165 Type 2 diabetes mellitus with hyperglycemia: Secondary | ICD-10-CM | POA: Diagnosis not present

## 2024-06-30 DIAGNOSIS — E782 Mixed hyperlipidemia: Secondary | ICD-10-CM | POA: Diagnosis not present

## 2024-06-30 DIAGNOSIS — I10 Essential (primary) hypertension: Secondary | ICD-10-CM | POA: Diagnosis not present

## 2024-06-30 DIAGNOSIS — I5030 Unspecified diastolic (congestive) heart failure: Secondary | ICD-10-CM | POA: Diagnosis not present

## 2024-06-30 DIAGNOSIS — E7800 Pure hypercholesterolemia, unspecified: Secondary | ICD-10-CM | POA: Diagnosis not present

## 2024-06-30 DIAGNOSIS — E1165 Type 2 diabetes mellitus with hyperglycemia: Secondary | ICD-10-CM | POA: Diagnosis not present

## 2024-06-30 DIAGNOSIS — E1140 Type 2 diabetes mellitus with diabetic neuropathy, unspecified: Secondary | ICD-10-CM | POA: Diagnosis not present

## 2024-07-07 DIAGNOSIS — H43813 Vitreous degeneration, bilateral: Secondary | ICD-10-CM | POA: Diagnosis not present

## 2024-07-07 DIAGNOSIS — H2513 Age-related nuclear cataract, bilateral: Secondary | ICD-10-CM | POA: Diagnosis not present

## 2024-07-23 DIAGNOSIS — E1140 Type 2 diabetes mellitus with diabetic neuropathy, unspecified: Secondary | ICD-10-CM | POA: Diagnosis not present

## 2024-07-23 DIAGNOSIS — E1165 Type 2 diabetes mellitus with hyperglycemia: Secondary | ICD-10-CM | POA: Diagnosis not present

## 2024-07-23 DIAGNOSIS — I5030 Unspecified diastolic (congestive) heart failure: Secondary | ICD-10-CM | POA: Diagnosis not present

## 2024-07-23 DIAGNOSIS — I10 Essential (primary) hypertension: Secondary | ICD-10-CM | POA: Diagnosis not present

## 2024-07-30 DIAGNOSIS — E1165 Type 2 diabetes mellitus with hyperglycemia: Secondary | ICD-10-CM | POA: Diagnosis not present

## 2024-07-30 DIAGNOSIS — E7800 Pure hypercholesterolemia, unspecified: Secondary | ICD-10-CM | POA: Diagnosis not present

## 2024-07-30 DIAGNOSIS — I5030 Unspecified diastolic (congestive) heart failure: Secondary | ICD-10-CM | POA: Diagnosis not present

## 2024-07-30 DIAGNOSIS — E1140 Type 2 diabetes mellitus with diabetic neuropathy, unspecified: Secondary | ICD-10-CM | POA: Diagnosis not present

## 2024-07-30 DIAGNOSIS — E782 Mixed hyperlipidemia: Secondary | ICD-10-CM | POA: Diagnosis not present

## 2024-07-30 DIAGNOSIS — I10 Essential (primary) hypertension: Secondary | ICD-10-CM | POA: Diagnosis not present

## 2024-08-07 DIAGNOSIS — Z79899 Other long term (current) drug therapy: Secondary | ICD-10-CM | POA: Diagnosis not present

## 2024-08-10 DIAGNOSIS — Z1331 Encounter for screening for depression: Secondary | ICD-10-CM | POA: Diagnosis not present

## 2024-08-10 DIAGNOSIS — E7800 Pure hypercholesterolemia, unspecified: Secondary | ICD-10-CM | POA: Diagnosis not present

## 2024-08-10 DIAGNOSIS — Z79899 Other long term (current) drug therapy: Secondary | ICD-10-CM | POA: Diagnosis not present

## 2024-08-10 DIAGNOSIS — Z0000 Encounter for general adult medical examination without abnormal findings: Secondary | ICD-10-CM | POA: Diagnosis not present

## 2024-08-10 DIAGNOSIS — E559 Vitamin D deficiency, unspecified: Secondary | ICD-10-CM | POA: Diagnosis not present

## 2024-08-10 NOTE — Progress Notes (Signed)
 Carmen Davies                                          MRN: [id number]   08/10/2024   The [facility] Quality Team Specialist reviewed this patient medical record for the purposes of chart review for care gap closure. The following were reviewed: chart review for care gap closure-glycemic status assessment.    [facility]I Quality Team
